# Patient Record
Sex: Male | Born: 1985 | Hispanic: No | Marital: Single | Smoking: Current every day smoker
Health system: Southern US, Community
[De-identification: ages and names within clinical notes are randomized; demographics above are authoritative.]

## PROBLEM LIST (undated history)

## (undated) HISTORY — PX: OTHER SURGICAL HISTORY: SHX169

## (undated) HISTORY — PX: WRIST ARTHROSCOPY: SUR100

---

## 2014-01-02 ENCOUNTER — Encounter (HOSPITAL_COMMUNITY): Payer: Self-pay | Admitting: Emergency Medicine

## 2014-01-02 ENCOUNTER — Emergency Department (HOSPITAL_COMMUNITY): Payer: Self-pay

## 2014-01-02 ENCOUNTER — Emergency Department (HOSPITAL_COMMUNITY)
Admission: EM | Admit: 2014-01-02 | Discharge: 2014-01-02 | Disposition: A | Discharge status: Home / Self Care | Payer: Self-pay | Attending: Emergency Medicine | Admitting: Emergency Medicine

## 2014-01-02 DIAGNOSIS — F1210 Cannabis abuse, uncomplicated: Secondary | ICD-10-CM | POA: Insufficient documentation

## 2014-01-02 DIAGNOSIS — T07XXXA Unspecified multiple injuries, initial encounter: Secondary | ICD-10-CM

## 2014-01-02 DIAGNOSIS — F22 Delusional disorders: Secondary | ICD-10-CM | POA: Insufficient documentation

## 2014-01-02 DIAGNOSIS — F10929 Alcohol use, unspecified with intoxication, unspecified: Secondary | ICD-10-CM

## 2014-01-02 DIAGNOSIS — IMO0002 Reserved for concepts with insufficient information to code with codable children: Secondary | ICD-10-CM | POA: Insufficient documentation

## 2014-01-02 DIAGNOSIS — S0083XA Contusion of other part of head, initial encounter: Secondary | ICD-10-CM | POA: Insufficient documentation

## 2014-01-02 DIAGNOSIS — F1010 Alcohol abuse, uncomplicated: Secondary | ICD-10-CM | POA: Insufficient documentation

## 2014-01-02 DIAGNOSIS — S0003XA Contusion of scalp, initial encounter: Secondary | ICD-10-CM | POA: Insufficient documentation

## 2014-01-02 DIAGNOSIS — S1093XA Contusion of unspecified part of neck, initial encounter: Secondary | ICD-10-CM

## 2014-01-02 LAB — CBC WITH DIFFERENTIAL/PLATELET
Basophils Absolute: 0 10*3/uL (ref 0.0–0.1)
Basophils Relative: 0 % (ref 0–1)
EOS ABS: 0 10*3/uL (ref 0.0–0.7)
Eosinophils Relative: 0 % (ref 0–5)
HCT: 47.5 % (ref 39.0–52.0)
Hemoglobin: 16.2 g/dL (ref 13.0–17.0)
LYMPHS ABS: 1.1 10*3/uL (ref 0.7–4.0)
Lymphocytes Relative: 12 % (ref 12–46)
MCH: 30.2 pg (ref 26.0–34.0)
MCHC: 34.1 g/dL (ref 30.0–36.0)
MCV: 88.5 fL (ref 78.0–100.0)
Monocytes Absolute: 0.4 10*3/uL (ref 0.1–1.0)
Monocytes Relative: 5 % (ref 3–12)
Neutro Abs: 7.2 10*3/uL (ref 1.7–7.7)
Neutrophils Relative %: 83 % — ABNORMAL HIGH (ref 43–77)
Platelets: 254 10*3/uL (ref 150–400)
RBC: 5.37 MIL/uL (ref 4.22–5.81)
RDW: 13.2 % (ref 11.5–15.5)
WBC: 8.7 10*3/uL (ref 4.0–10.5)

## 2014-01-02 LAB — BASIC METABOLIC PANEL
BUN: 8 mg/dL (ref 6–23)
CO2: 22 mEq/L (ref 19–32)
Calcium: 8.9 mg/dL (ref 8.4–10.5)
Chloride: 104 mEq/L (ref 96–112)
Creatinine, Ser: 1.04 mg/dL (ref 0.50–1.35)
GFR calc non Af Amer: >90 mL/min (ref >90)
GLUCOSE: 142 mg/dL — AB (ref 70–99)
Potassium: 3.3 mEq/L — ABNORMAL LOW (ref 3.7–5.3)
SODIUM: 141 meq/L (ref 137–147)

## 2014-01-02 LAB — ETHANOL: ALCOHOL ETHYL (B): 219 mg/dL — AB (ref 0–11)

## 2014-01-02 LAB — RAPID URINE DRUG SCREEN, HOSP PERFORMED
Amphetamines: NOT DETECTED
Barbiturates: NOT DETECTED
Benzodiazepines: NOT DETECTED
COCAINE: NOT DETECTED
Opiates: NOT DETECTED
Tetrahydrocannabinol: POSITIVE — AB

## 2014-01-02 NOTE — Discharge Instructions (Signed)
Alcohol Intoxication °Alcohol intoxication occurs when the amount of alcohol that a person has consumed impairs his or her ability to mentally and physically function. Alcohol directly impairs the normal chemical activity of the brain. Drinking large amounts of alcohol can lead to changes in mental function and behavior, and it can cause many physical effects that can be harmful.  °Alcohol intoxication can range in severity from mild to very severe. Various factors can affect the level of intoxication that occurs, such as the person's age, gender, weight, frequency of alcohol consumption, and the presence of other medical conditions (such as diabetes, seizures, or heart conditions). Dangerous levels of alcohol intoxication may occur when people drink large amounts of alcohol in a short period (binge drinking). Alcohol can also be especially dangerous when combined with certain prescription medicines or "recreational" drugs. °SIGNS AND SYMPTOMS °Some common signs and symptoms of mild alcohol intoxication include: °· Loss of coordination. °· Changes in mood and behavior. °· Impaired judgment. °· Slurred speech. °As alcohol intoxication progresses to more severe levels, other signs and symptoms will appear. These may include: °· Vomiting. °· Confusion and impaired memory. °· Slowed breathing. °· Seizures. °· Loss of consciousness. °DIAGNOSIS  °Your health care provider will take a medical history and perform a physical exam. You will be asked about the amount and type of alcohol you have consumed. Blood tests will be done to measure the concentration of alcohol in your blood. In many places, your blood alcohol level must be lower than 80 mg/dL (0.08%) to legally drive. However, many dangerous effects of alcohol can occur at much lower levels.  °TREATMENT  °People with alcohol intoxication often do not require treatment. Most of the effects of alcohol intoxication are temporary, and they go away as the alcohol naturally  leaves the body. Your health care provider will monitor your condition until you are stable enough to go home. Fluids are sometimes given through an IV access tube to help prevent dehydration.  °HOME CARE INSTRUCTIONS °· Do not drive after drinking alcohol. °· Stay hydrated. Drink enough water and fluids to keep your urine clear or pale yellow. Avoid caffeine.   °· Only take over-the-counter or prescription medicines as directed by your health care provider.   °SEEK MEDICAL CARE IF:  °· You have persistent vomiting.   °· You do not feel better after a few days. °· You have frequent alcohol intoxication. Your health care provider can help determine if you should see a substance use treatment counselor. °SEEK IMMEDIATE MEDICAL CARE IF:  °· You become shaky or tremble when you try to stop drinking.   °· You shake uncontrollably (seizure).   °· You throw up (vomit) blood. This may be bright red or may look like black coffee grounds.   °· You have blood in your stool. This may be bright red or may appear as a black, tarry, bad smelling stool.   °· You become lightheaded or faint.   °MAKE SURE YOU:  °· Understand these instructions. °· Will watch your condition. °· Will get help right away if you are not doing well or get worse. °Document Released: 04/19/2005 Document Revised: 03/12/2013 Document Reviewed: 12/13/2012 °Exit[employer]® Patient Information ©2014 [employer]. ° °Alcohol Problems °Most adults who drink alcohol drink in moderation (not a lot) are at low risk for developing problems related to their drinking. However, all drinkers, including low-risk drinkers, should know about the health risks connected with drinking alcohol. °RECOMMENDATIONS FOR LOW-RISK DRINKING  °Drink in moderation. Moderate drinking is defined as follows:  °·   Men - no more than 2 drinks per day. °· Nonpregnant women - no more than 1 drink per day. °· Over age 65 - no more than 1 drink per day. °A standard drink is 12 grams of pure alcohol,  which is equal to a 12 ounce bottle of beer or wine cooler, a 5 ounce glass of wine, or 1.5 ounces of distilled spirits (such as whiskey, brandy, vodka, or rum).  °ABSTAIN FROM (DO NOT DRINK) ALCOHOL: °· When pregnant or considering pregnancy. °· When taking a medication that interacts with alcohol. °· If you are alcohol dependent. °· A medical condition that prohibits drinking alcohol (such as ulcer, liver disease, or heart disease). °DISCUSS WITH YOUR CAREGIVER: °· If you are at risk for coronary heart disease, discuss the potential benefits and risks of alcohol use: Light to moderate drinking is associated with lower rates of coronary heart disease in certain populations (for example, men over age 45 and postmenopausal women). Infrequent or nondrinkers are advised not to begin light to moderate drinking to reduce the risk of coronary heart disease so as to avoid creating an alcohol-related problem. Similar protective effects can likely be gained through proper diet and exercise. °· Women and the elderly have smaller amounts of body water than men. As a result women and the elderly achieve a higher blood alcohol concentration after drinking the same amount of alcohol. °· Exposing a fetus to alcohol can cause a broad range of birth defects referred to as Fetal Alcohol Syndrome (FAS) or Alcohol-Related Birth Defects (ARBD). Although FAS/ARBD is connected with excessive alcohol consumption during pregnancy, studies also have reported neurobehavioral problems in infants born to mothers reporting drinking an average of 1 drink per day during pregnancy. °· Heavier drinking (the consumption of more than 4 drinks per occasion by men and more than 3 drinks per occasion by women) impairs learning (cognitive) and psychomotor functions and increases the risk of alcohol-related problems, including accidents and injuries. °CAGE QUESTIONS:  °· Have you ever felt that you should Cut down on your drinking? °· Have people Annoyed  you by criticizing your drinking? °· Have you ever felt bad or Guilty about your drinking? °· Have you ever had a drink first thing in the morning to steady your nerves or get rid of a hangover (Eye opener)? °If you answered positively to any of these questions: You may be at risk for alcohol-related problems if alcohol consumption is:  °· Men: Greater than 14 drinks per week or more than 4 drinks per occasion. °· Women: Greater than 7 drinks per week or more than 3 drinks per occasion. °Do you or your family have a medical history of alcohol-related problems, such as: °· Blackouts. °· Sexual dysfunction. °· Depression. °· Trauma. °· Liver dysfunction. °· Sleep disorders. °· Hypertension. °· Chronic abdominal pain. °· Has your drinking ever caused you problems, such as problems with your family, problems with your work (or school) performance, or accidents/injuries? °· Do you have a compulsion to drink or a preoccupation with drinking? °· Do you have poor control or are you unable to stop drinking once you have started? °· Do you have to drink to avoid withdrawal symptoms? °· Do you have problems with withdrawal such as tremors, nausea, sweats, or mood disturbances? °· Does it take more alcohol than in the past to get you high? °· Do you feel a strong urge to drink? °· Do you change your plans so that you can have a drink? °·   Do you ever drink in the morning to relieve the shakes or a hangover? If you have answered a number of the previous questions positively, it may be time for you to talk to your caregivers, family, and friends and see if they think you have a problem. Alcoholism is a chemical dependency that keeps getting worse and will eventually destroy your health and relationships. Many alcoholics end up dead, impoverished, or in prison. This is often the end result of all chemical dependency.  Do not be discouraged if you are not ready to take action immediately.  Decisions to change behavior often  involve up and down desires to change and feeling like you cannot decide.  Try to think more seriously about your drinking behavior.  Think of the reasons to quit. WHERE TO GO FOR ADDITIONAL INFORMATION   The National Institute on Alcohol Abuse and Alcoholism (NIAAA) BasicStudents.dkwww.niaaa.nih.gov  [employer] on Alcoholism and Drug Dependence (NCADD) www.ncadd.org  American Society of Addiction Medicine (ASAM) RoyalDiary.glwww.asam.org  Document Released: 07/10/2005 Document Revised: 10/02/2011 Document Reviewed: 02/26/2008 [facility] Patient Information 2014 [place], [place].   Alcohol Use Disorder Alcohol use disorder is a mental disorder. It is not a one-time incident of heavy drinking. Alcohol use disorder is the excessive and uncontrollable use of alcohol over time that leads to problems with functioning in one or more areas of daily living. People with this disorder risk harming themselves and others when they drink to excess. Alcohol use disorder also can cause other mental disorders, such as mood and anxiety disorders, and serious physical problems. People with alcohol use disorder often misuse other drugs.  Alcohol use disorder is common and widespread. Some people with this disorder drink alcohol to cope with or escape from negative life events. Others drink to relieve chronic pain or symptoms of mental illness. People with a family history of alcohol use disorder are at higher risk of losing control and using alcohol to excess.  SYMPTOMS  Signs and symptoms of alcohol use disorder may include the following:   Consumption ofalcohol inlarger amounts or over a longer period of time than intended.  Multiple unsuccessful attempts to cutdown or control alcohol use.   A great deal of time spent obtaining alcohol, using alcohol, or recovering from the effects of alcohol (hangover).  A strong desire or urge to use alcohol (cravings).   Continued use of alcohol despite problems at work, school, or  home because of alcohol use.   Continued use of alcohol despite problems in relationships because of alcohol use.  Continued use of alcohol in situations when it is physically hazardous, such as driving a car.  Continued use of alcohol despite awareness of a physical or psychological problem that is likely related to alcohol use. Physical problems related to alcohol use can involve the brain, heart, liver, stomach, and intestines. Psychological problems related to alcohol use include intoxication, depression, anxiety, psychosis, delirium, and dementia.   The need for increased amounts of alcohol to achieve the same desired effect, or a decreased effect from the consumption of the same amount of alcohol (tolerance).  Withdrawal symptoms upon reducing or stopping alcohol use, or alcohol use to reduce or avoid withdrawal symptoms. Withdrawal symptoms include:  Racing heart.  Hand tremor.  Difficulty sleeping.  Nausea.  Vomiting.  Hallucinations.  Restlessness.  Seizures. DIAGNOSIS Alcohol use disorder is diagnosed through an assessment by your caregiver. Your caregiver may start by asking three or four questions to screen for excessive or problematic alcohol use. To  confirm a diagnosis of alcohol use disorder, at least two symptoms (see SYMPTOMS) must be present within a 81-month period. The severity of alcohol use disorder depends on the number of symptoms:  Mild two or three.  Moderate four or five.  Severe six or more. Your caregiver may perform a physical exam or use results from lab tests to see if you have physical problems resulting from alcohol use. Your caregiver may refer you to a mental health professional for evaluation. TREATMENT  Some people with alcohol use disorder are able to reduce their alcohol use to low-risk levels. Some people with alcohol use disorder need to quit drinking alcohol. When necessary, mental health professionals with specialized training in  substance use treatment can help. Your caregiver can help you decide how severe your alcohol use disorder is and what type of treatment you need. The following forms of treatment are available:   Detoxification. Detoxification involves the use of prescription medication to prevent alcohol withdrawal symptoms in the first week after quitting. This is important for people with a history of symptoms of withdrawal and for heavy drinkers who are likely to have withdrawal symptoms. Alcohol withdrawal can be dangerous and, in severe cases, cause death. Detoxification is usually provided in a hospital or in-patient substance use treatment facility.  Counseling or talk therapy. Talk therapy is provided by substance use treatment counselors. It addresses the reasons people use alcohol and ways to keep them from drinking again. The goals of talk therapy are to help people with alcohol use disorder find healthy activities and ways to cope with life stress, to identify and avoid triggers for alcohol use, and to handle cravings, which can cause relapse.  Medication.Different medications can help treat alcohol use disorder through the following actions:  Decrease alcohol cravings.  Decrease the positive reward response felt from alcohol use.  Produce an uncomfortable physical reaction when alcohol is used (aversion therapy).  Support groups. Support groups are run by people who have quit drinking. They provide emotional support, advice, and guidance. These forms of treatment are often combined. Some people with alcohol use disorder benefit from intensive combination treatment provided by specialized substance use treatment centers. Both inpatient and outpatient treatment programs are available. Document Released: 08/17/2004 Document Revised: 03/12/2013 Document Reviewed: 10/17/2012 [facility] Patient Information 2014 [place], [place].   Abrasions An abrasion is a cut or scrape of the skin. Abrasions do not go  through all layers of the skin. HOME CARE  If a bandage (dressing) was put on your wound, change it as told by your doctor. If the bandage sticks, soak it off with warm.  Wash the area with water and soap 2 times a day. Rinse off the soap. Pat the area dry with a clean towel.  Put on medicated cream (ointment) as told by your doctor.  Change your bandage right away if it gets wet or dirty.  Only take medicine as told by your doctor.  See your doctor within 24 48 hours to get your wound checked.  Check your wound for redness, puffiness (swelling), or yellowish-white fluid (pus). GET HELP RIGHT AWAY IF:   You have more pain in the wound.  You have redness, swelling, or tenderness around the wound.  You have pus coming from the wound.  You have a fever or lasting symptoms for more than 2 3 days.  You have a fever and your symptoms suddenly get worse.  You have a bad smell coming from the wound or bandage. MAKE  SURE YOU:   Understand these instructions.  Will watch your condition.  Will get help right away if you are not doing well or get worse. Document Released: 12/27/2007 Document Revised: 04/03/2012 Document Reviewed: 06/13/2011 [facility] Patient Information 2014 [place], [place].   Alcohol Withdrawal Anytime drug use is interfering with normal living activities it has become abuse. This includes problems with family and friends. Psychological dependence has developed when your mind tells you that the drug is needed. This is usually followed by physical dependence when a continuing increase of drugs are required to get the same feeling or "high." This is known as addiction or chemical dependency. A person's risk is much higher if there is a history of chemical dependency in the family. Mild Withdrawal Following Stopping Alcohol, When Addiction or Chemical Dependency Has Developed When a person has developed tolerance to alcohol, any sudden stopping of alcohol can cause  uncomfortable physical symptoms. Most of the time these are mild and consist of tremors in the hands and increases in heart rate, breathing, and temperature. Sometimes these symptoms are associated with anxiety, panic attacks, and bad dreams. There may also be stomach upset. Normal sleep patterns are often interrupted with periods of inability to sleep (insomnia). This may last for 6 months. Because of this discomfort, many people choose to continue drinking to get rid of this discomfort and to try to feel normal. Severe Withdrawal with Decreased or No Alcohol Intake, When Addiction or Chemical Dependency Has Developed About five percent of alcoholics will develop signs of severe withdrawal when they stop using alcohol. One sign of this is development of generalized seizures (convulsions). Other signs of this are severe agitation and confusion. This may be associated with believing in things which are not real or seeing things which are not really there (delusions and hallucinations). Vitamin deficiencies are usually present if alcohol intake has been long-term. Treatment for this most often requires hospitalization and close observation. Addiction can only be helped by stopping use of all chemicals. This is hard but may save your life. With continual alcohol use, possible outcomes are usually loss of self respect and esteem, violence, and death. Addiction cannot be cured but it can be stopped. This often requires outside help and the care of professionals. Treatment centers are listed in the yellow pages under Cocaine, Narcotics, and Alcoholics Anonymous. Most hospitals and clinics can refer you to a specialized care center. It is not necessary for you to go through the uncomfortable symptoms of withdrawal. Your caregiver can provide you with medicines that will help you through this difficult period. Try to avoid situations, friends, or drugs that made it possible for you to keep using alcohol in the past.  Learn how to say no. It takes a long period of time to overcome addictions to all drugs, including alcohol. There may be many times when you feel as though you want a drink. After getting rid of the physical addiction and withdrawal, you will have a lessening of the craving which tells you that you need alcohol to feel normal. Call your caregiver if more support is needed. Learn who to talk to in your family and among your friends so that during these periods you can receive outside help. Alcoholics Anonymous (AA) has helped many people over the years. To get further help, contact AA or call your caregiver, counselor, or clergyperson. Al-Anon and Alateen are support groups for friends and family members of an alcoholic. The people who love and care for an  alcoholic often need help, too. For information about these organizations, check your phone directory or call a local alcoholism treatment center.  SEEK IMMEDIATE MEDICAL CARE IF:   You have a seizure.  You have a fever.  You experience uncontrolled vomiting or you vomit up blood. This may be bright red or look like black coffee grounds.  You have blood in the stool. This may be bright red or appear as a black, tarry, bad-smelling stool.  You become lightheaded or faint. Do not drive if you feel this way. Have someone else drive you or call [phone] for help.  You become more agitated or confused.  You develop uncontrolled anxiety.  You begin to see things that are not really there (hallucinate). Your caregiver has determined that you completely understand your medical condition, and that your mental state is back to normal. You understand that you have been treated for alcohol withdrawal, have agreed not to drink any alcohol for a minimum of 1 day, will not operate a car or other machinery for 24 hours, and have had an opportunity to ask any questions about your condition. Document Released: 04/19/2005 Document Revised: 10/02/2011 Document Reviewed:  02/26/2008 [facility] Patient Information 2014 [place], [place].  Contusion A contusion is a deep bruise. Contusions happen when an injury causes bleeding under the skin. Signs of bruising include pain, puffiness (swelling), and discolored skin. The contusion may turn blue, purple, or yellow. HOME CARE   Put ice on the injured area.  Put ice in a plastic bag.  Place a towel between your skin and the bag.  Leave the ice on for 15-20 minutes, 03-04 times a day.  Only take medicine as told by your doctor.  Rest the injured area.  If possible, raise (elevate) the injured area to lessen puffiness. GET HELP RIGHT AWAY IF:   You have more bruising or puffiness.  You have pain that is getting worse.  Your puffiness or pain is not helped by medicine. MAKE SURE YOU:   Understand these instructions.  Will watch your condition.  Will get help right away if you are not doing well or get worse. Document Released: 12/27/2007 Document Revised: 10/02/2011 Document Reviewed: 05/15/2011 [facility] Patient Information 2014 [place], [place].  Cryotherapy Cryotherapy means treatment with cold. Ice or gel packs can be used to reduce both pain and swelling. Ice is the most helpful within the first 24 to 48 hours after an injury or flareup from overusing a muscle or joint. Sprains, strains, spasms, burning pain, shooting pain, and aches can all be eased with ice. Ice can also be used when recovering from surgery. Ice is effective, has very few side effects, and is safe for most people to use. PRECAUTIONS  Ice is not a safe treatment option for people with:  Raynaud's phenomenon. This is a condition affecting small blood vessels in the extremities. Exposure to cold may cause your problems to return.  Cold hypersensitivity. There are many forms of cold hypersensitivity, including:  Cold urticaria. Red, itchy hives appear on the skin when the tissues begin to warm after being iced.  Cold erythema. This  is a red, itchy rash caused by exposure to cold.  Cold hemoglobinuria. Red blood cells break down when the tissues begin to warm after being iced. The hemoglobin that carry oxygen are passed into the urine because they cannot combine with blood proteins fast enough.  Numbness or altered sensitivity in the area being iced. If you have any of the following conditions,  do not use ice until you have discussed cryotherapy with your caregiver:  Heart conditions, such as arrhythmia, angina, or chronic heart disease.  High blood pressure.  Healing wounds or open skin in the area being iced.  Current infections.  Rheumatoid arthritis.  Poor circulation.  Diabetes. Ice slows the blood flow in the region it is applied. This is beneficial when trying to stop inflamed tissues from spreading irritating chemicals to surrounding tissues. However, if you expose your skin to cold temperatures for too long or without the proper protection, you can damage your skin or nerves. Watch for signs of skin damage due to cold. HOME CARE INSTRUCTIONS Follow these tips to use ice and cold packs safely.  Place a dry or damp towel between the ice and skin. A damp towel will cool the skin more quickly, so you may need to shorten the time that the ice is used.  For a more rapid response, add gentle compression to the ice.  Ice for no more than 10 to 20 minutes at a time. The bonier the area you are icing, the less time it will take to get the benefits of ice.  Check your skin after 5 minutes to make sure there are no signs of a poor response to cold or skin damage.  Rest 20 minutes or more in between uses.  Once your skin is numb, you can end your treatment. You can test numbness by very lightly touching your skin. The touch should be so light that you do not see the skin dimple from the pressure of your fingertip. When using ice, most people will feel these normal sensations in this order: cold, burning, aching,  and numbness.  Do not use ice on someone who cannot communicate their responses to pain, such as small children or people with dementia. HOW TO MAKE AN ICE PACK Ice packs are the most common way to use ice therapy. Other methods include ice massage, ice baths, and cryo-sprays. Muscle creams that cause a cold, tingly feeling do not offer the same benefits that ice offers and should not be used as a substitute unless recommended by your caregiver. To make an ice pack, do one of the following:  Place crushed ice or a bag of frozen vegetables in a sealable plastic bag. Squeeze out the excess air. Place this bag inside another plastic bag. Slide the bag into a pillowcase or place a damp towel between your skin and the bag.  Mix 3 parts water with 1 part rubbing alcohol. Freeze the mixture in a sealable plastic bag. When you remove the mixture from the freezer, it will be slushy. Squeeze out the excess air. Place this bag inside another plastic bag. Slide the bag into a pillowcase or place a damp towel between your skin and the bag. SEEK MEDICAL CARE IF:  You develop white spots on your skin. This may give the skin a blotchy (mottled) appearance.  Your skin turns blue or pale.  Your skin becomes waxy or hard.  Your swelling gets worse. MAKE SURE YOU:   Understand these instructions.  Will watch your condition.  Will get help right away if you are not doing well or get worse. Document Released: 03/06/2011 Document Revised: 10/02/2011 Document Reviewed: 03/06/2011 [facility] Patient Information 2014 [place], [place].
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
## 2014-01-02 NOTE — ED Notes (Signed)
Pt's father coming to ED to transport pt home.

## 2014-01-02 NOTE — ED Notes (Signed)
Patient transported to CT via stretcher.
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
## 2014-01-02 NOTE — ED Notes (Signed)
Dr Otter at bedside  

## 2014-01-02 NOTE — ED Notes (Signed)
Writer requested for a urine sample and pt brought back a cup of water in a specimen cup, pt sts that it was water and he did urinate in the specimen cup.  Writer notified pt that his urine sample was cold and looked exactly like water.  Writer notified RN of patient activities.
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
## 2016-10-04 ENCOUNTER — Emergency Department (HOSPITAL_COMMUNITY)
Admission: EM | Admit: 2016-10-04 | Discharge: 2016-10-06 | Disposition: A | Discharge status: Other Acute Inpatient Hospital | Payer: Self-pay

## 2016-10-04 ENCOUNTER — Encounter (HOSPITAL_COMMUNITY): Payer: Self-pay | Admitting: *Deleted

## 2016-10-04 DIAGNOSIS — K047 Periapical abscess without sinus: Secondary | ICD-10-CM | POA: Insufficient documentation

## 2016-10-04 DIAGNOSIS — T426X2A Poisoning by other antiepileptic and sedative-hypnotic drugs, intentional self-harm, initial encounter: Secondary | ICD-10-CM | POA: Insufficient documentation

## 2016-10-04 DIAGNOSIS — T50902A Poisoning by unspecified drugs, medicaments and biological substances, intentional self-harm, initial encounter: Secondary | ICD-10-CM

## 2016-10-04 DIAGNOSIS — F17210 Nicotine dependence, cigarettes, uncomplicated: Secondary | ICD-10-CM | POA: Insufficient documentation

## 2016-10-04 DIAGNOSIS — T428X2A Poisoning by antiparkinsonism drugs and other central muscle-tone depressants, intentional self-harm, initial encounter: Secondary | ICD-10-CM | POA: Insufficient documentation

## 2016-10-04 DIAGNOSIS — F1120 Opioid dependence, uncomplicated: Secondary | ICD-10-CM | POA: Insufficient documentation

## 2016-10-04 LAB — COMPREHENSIVE METABOLIC PANEL
ALT: 13 U/L — ABNORMAL LOW (ref 17–63)
ANION GAP: 7 (ref 5–15)
AST: 15 U/L (ref 15–41)
Albumin: 4.4 g/dL (ref 3.5–5.0)
Alkaline Phosphatase: 71 U/L (ref 38–126)
BUN: 6 mg/dL (ref 6–20)
CO2: 30 mmol/L (ref 22–32)
Calcium: 9.1 mg/dL (ref 8.9–10.3)
Chloride: 101 mmol/L (ref 101–111)
Creatinine, Ser: 1.24 mg/dL (ref 0.61–1.24)
GFR calc Af Amer: >60 mL/min (ref >60)
Glucose, Bld: 89 mg/dL (ref 65–99)
POTASSIUM: 3.6 mmol/L (ref 3.5–5.1)
Sodium: 138 mmol/L (ref 135–145)
Total Bilirubin: 1 mg/dL (ref 0.3–1.2)
Total Protein: 7.6 g/dL (ref 6.5–8.1)

## 2016-10-04 LAB — CBC WITH DIFFERENTIAL/PLATELET
BASOS ABS: 0 10*3/uL (ref 0.0–0.1)
Basophils Relative: 0 %
Eosinophils Absolute: 0.2 10*3/uL (ref 0.0–0.7)
Eosinophils Relative: 2 %
HCT: 48.3 % (ref 39.0–52.0)
Hemoglobin: 16.4 g/dL (ref 13.0–17.0)
LYMPHS PCT: 21 %
Lymphs Abs: 2.4 10*3/uL (ref 0.7–4.0)
MCH: 30.4 pg (ref 26.0–34.0)
MCHC: 34 g/dL (ref 30.0–36.0)
MCV: 89.6 fL (ref 78.0–100.0)
Monocytes Absolute: 0.8 10*3/uL (ref 0.1–1.0)
Monocytes Relative: 7 %
Neutro Abs: 7.9 10*3/uL — ABNORMAL HIGH (ref 1.7–7.7)
Neutrophils Relative %: 70 %
PLATELETS: 255 10*3/uL (ref 150–400)
RBC: 5.39 MIL/uL (ref 4.22–5.81)
RDW: 13.2 % (ref 11.5–15.5)
WBC: 11.3 10*3/uL — AB (ref 4.0–10.5)

## 2016-10-04 LAB — URINALYSIS, ROUTINE W REFLEX MICROSCOPIC
Glucose, UA: NEGATIVE mg/dL
HGB URINE DIPSTICK: NEGATIVE
KETONES UR: NEGATIVE mg/dL
Leukocytes, UA: NEGATIVE
Nitrite: NEGATIVE
PROTEIN: NEGATIVE mg/dL
Specific Gravity, Urine: 1.019 (ref 1.005–1.030)
pH: 5 (ref 5.0–8.0)

## 2016-10-04 LAB — RAPID URINE DRUG SCREEN, HOSP PERFORMED
AMPHETAMINES: NOT DETECTED
Barbiturates: NOT DETECTED
Benzodiazepines: POSITIVE — AB
Cocaine: NOT DETECTED
OPIATES: POSITIVE — AB
Tetrahydrocannabinol: POSITIVE — AB

## 2016-10-04 LAB — ETHANOL

## 2016-10-04 LAB — SALICYLATE LEVEL: Salicylate Lvl: <7 mg/dL (ref 2.8–30.0)

## 2016-10-04 LAB — ACETAMINOPHEN LEVEL

## 2016-10-04 MED ORDER — SODIUM CHLORIDE 0.9 % IV BOLUS (SEPSIS)
500.0000 mL | Freq: Once | INTRAVENOUS | Status: AC
  Administered 2016-10-04: 500 mL via INTRAVENOUS

## 2016-10-04 NOTE — ED Provider Notes (Signed)
[facility] DEPT Provider Note   CSN: [id number] Arrival date & time: 10/04/16  1922     History   Chief Complaint Chief Complaint  Patient presents with  . Ingestion    HPI Willie Ruiz is a 31 y.o. male.  The patient was at home with his mother, around 5 PM tonight, when he took "2 handfuls of pills".  His mother thinks he took 20 300 mg gabapentin, 8-10 750 mg Robaxin, and a couple of Phenergan 25 mg tablets.  The patient is vague about why he took this medicine, and states "I want to see how they tasted".  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  He went to a local ED 2 days ago, and was prescribed the medicines that he overdosed on, as treatment for withdrawal from oxycodone.  He was given outpatient resources, but did not choose to use them.  He apparently lives with his mother currently.  He is here in the ED with a friend.  He states that he typically uses about 100 mg of oxycodone each day, but has not had any since 3 days ago.  He feels like he is going through withdrawal, having symptoms including; inability to manage body temperature, nausea, vomiting, achiness, abdominal cramping, and confusion.  He denies use of other drugs or alcohol.  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  He has not seen a dentist in quite some time.  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  There are no other known modifying factors.   HPI  History reviewed. No pertinent past medical history.  Patient Active Problem List   Diagnosis Date Noted  . Alcohol intoxication (HCC) 01/02/2014  . Marijuana use 01/02/2014  . Nasal bones, closed fracture 01/02/2014    History reviewed. No pertinent surgical history.     Home Medications    Prior to Admission medications   Not on File    Family History No family history on file.  Social History Social History    Substance Use Topics  . Smoking status: Current Every Day Smoker    Types: Cigarettes  . Smokeless tobacco: Never Used  . Alcohol use Yes     Allergies   Patient has no known allergies.   Review of Systems Review of Systems  All other systems reviewed and are negative.    Physical Exam Updated Vital Signs BP 117/98 (BP Location: Right Arm)   Pulse 84   Temp 98.4 F (36.9 C) (Oral)   Resp 16   SpO2 97%   Physical Exam  Constitutional: He is oriented to person, place, and time. He appears well-developed and well-nourished. He appears distressed (He is uncomfortable).  HENT:  Head: Normocephalic and atraumatic.  Right Ear: External ear normal.  Left Ear: External ear normal.  Eyes: Conjunctivae and EOM are normal. Pupils are equal, round, and reactive to light.  Neck: Normal range of motion and phonation normal. Neck supple.  Cardiovascular: Normal rate, regular rhythm and normal heart sounds.   Pulmonary/Chest: Effort normal and breath sounds normal. He exhibits no bony tenderness.  Abdominal: Soft. He exhibits no distension. There is no tenderness.  Musculoskeletal: Normal range of motion.  Neurological: He is alert and oriented to person, place, and time. No cranial nerve deficit or sensory deficit. He exhibits normal muscle tone. Coordination normal.  He is dysarthric.  There is no aphasia or nystagmus.  Skin: Skin is warm, dry and intact.  Psychiatric: He has a normal mood and affect. His behavior is normal. Judgment and thought content normal.  Nursing note and vitals reviewed.    ED Treatments / Results  Labs (all labs ordered are listed, but only abnormal results are displayed) Labs Reviewed  COMPREHENSIVE METABOLIC PANEL - Abnormal; Notable for the following:       Result Value   ALT 13 (*)    All other components within normal limits  CBC WITH DIFFERENTIAL/PLATELET - Abnormal; Notable for the following:    WBC 11.3 (*)    Neutro Abs 7.9 (*)    All  other components within normal limits  ACETAMINOPHEN LEVEL - Abnormal; Notable for the following:    Acetaminophen (Tylenol), Serum <10 (*)    All other components within normal limits  URINALYSIS, ROUTINE W REFLEX MICROSCOPIC - Abnormal; Notable for the following:    Bilirubin Urine SMALL (*)    All other components within normal limits  RAPID URINE DRUG SCREEN, HOSP PERFORMED - Abnormal; Notable for the following:    Opiates POSITIVE (*)    Benzodiazepines POSITIVE (*)    Tetrahydrocannabinol POSITIVE (*)    All other components within normal limits  SALICYLATE LEVEL  ETHANOL    EKG  EKG Interpretation  Date/Time:  Wednesday October 04 2016 21:04:20 EDT Ventricular Rate:  91 PR Interval:    QRS Duration: 105 QT Interval:  344 QTC Calculation: 424 R Axis:   -18 Text Interpretation:  Sinus rhythm Left atrial enlargement Borderline left axis deviation RSR' in V1 or V2, probably normal variant Baseline wander in lead(s) V3 Artifact No old tracing to compare Confirmed by [facility]  MD, DAVID ([id number]) on 10/04/2016 9:09:04 PM       Radiology No results found.  Procedures Procedures (including critical care time)  Medications Ordered in ED Medications  sodium chloride 0.9 % bolus 500 mL (500 mLs Intravenous New Bag/Given 10/04/16 2129)     Initial Impression / Assessment and Plan / ED Course  I have reviewed the triage vital signs and the nursing notes.  Pertinent labs & imaging results that were available during my care of the patient were reviewed by me and considered in my medical decision making (see chart for details).  Clinical Course as of Oct 06 102  Wed Oct 04, 2016  2136 I have discussed the case in the current findings, with [facility] poison control.  We are advising repeat EKG at 4 hours, and observe for 8 hours, in addition to the treatments and observation already ordered.  [EW]  2138 Involuntary commitment paperwork filled out by me and notarized  [EW]      Clinical Course User Index [EW] Mancel Bale, MD    Medications  sodium chloride 0.9 % bolus 500 mL (500 mLs Intravenous New Bag/Given 10/04/16 2129)    Patient Vitals for the past 24 hrs:  BP Temp Temp src Pulse Resp SpO2  10/04/16 2116 117/98 - - 84 16 97 %  10/04/16 2100 139/90 - - 83 11 99 %  10/04/16 2030 141/98 - - 92 25 99 %  10/04/16 1937 135/95 98.4 F (36.9 C) Oral 115 22 99 %    1:04 AM Reevaluation with update and discussion. After initial assessment and treatment, an updated evaluation reveals patient is more alert now, is acting very animated, and refusing to have repeat EKG done.  Medical clearance underway, will be complete  at 0400 hours. Brenda Samano L    Final Clinical Impressions(s) / ED Diagnoses   Final diagnoses:  Intentional drug overdose, initial encounter [facility](HCC)  Narcotic addiction (HCC)   Intentional overdose, essentially suicide gesture, without stated intent.  Patient is psychiatrically unstable, currently going through narcotic withdrawal.  He has been placed under involuntary commitment.  He will require evaluation by psychiatry prior to disposition.  Nursing Notes Reviewed/ Care Coordinated Applicable Imaging Reviewed Interpretation of Laboratory Data incorporated into ED treatment   Plan- As per TTS in conjunction with oncoming provider team   New Prescriptions New Prescriptions   No medications on file     Mancel BaleElliott Shyloh Derosa, MD 10/05/16 [phone]

## 2016-10-04 NOTE — ED Triage Notes (Signed)
Pt comes in taking he took:  "about 2 handfulls" of 300mg  gabapentin (mother thinks it was 20pills),  8-10tab 750mg  robaxin 1-2 tab phenergan 25mg  po  Pt states that he took this just because.  Mother is with his and states that he took this to harm himself.  [facility] was [facility] at [facility] on Monday wanting help getting over his oxycodone addiction (pt is buying these on the streets) and the medications that he took today were prescribed to him "to help him get off of oxycodone" per mother.    Pt called crisis center and told them he wanted help to get off drugs.

## 2016-10-05 ENCOUNTER — Inpatient Hospital Stay (HOSPITAL_COMMUNITY)
Admission: EM | Admit: 2016-10-05 | Payer: Federal, State, Local not specified - Other | Source: Intra-hospital | Admitting: Psychiatry

## 2016-10-05 LAB — I-STAT TROPONIN, ED: TROPONIN I, POC: 0 ng/mL (ref 0.00–0.08)

## 2016-10-05 MED ORDER — HYDROXYZINE HCL 25 MG PO TABS
25.0000 mg | ORAL_TABLET | ORAL | Status: DC | PRN
  Administered 2016-10-05 – 2016-10-06 (×2): 25 mg via ORAL
  Filled 2016-10-05 (×2): qty 1

## 2016-10-05 MED ORDER — LORAZEPAM 1 MG PO TABS
1.0000 mg | ORAL_TABLET | Freq: Three times a day (TID) | ORAL | Status: DC | PRN
  Administered 2016-10-05: 1 mg via ORAL
  Filled 2016-10-05: qty 1

## 2016-10-05 MED ORDER — TRAMADOL HCL 50 MG PO TABS
50.0000 mg | ORAL_TABLET | Freq: Once | ORAL | Status: AC
  Administered 2016-10-05: 50 mg via ORAL
  Filled 2016-10-05: qty 1

## 2016-10-05 MED ORDER — CLINDAMYCIN HCL 150 MG PO CAPS
300.0000 mg | ORAL_CAPSULE | Freq: Four times a day (QID) | ORAL | Status: DC
  Administered 2016-10-05 – 2016-10-06 (×4): 300 mg via ORAL
  Filled 2016-10-05 (×4): qty 2

## 2016-10-05 MED ORDER — IBUPROFEN 400 MG PO TABS
600.0000 mg | ORAL_TABLET | Freq: Three times a day (TID) | ORAL | Status: DC | PRN
  Administered 2016-10-05 – 2016-10-06 (×3): 600 mg via ORAL
  Filled 2016-10-05 (×3): qty 1

## 2016-10-05 MED ORDER — ACETAMINOPHEN 325 MG PO TABS
650.0000 mg | ORAL_TABLET | ORAL | Status: DC | PRN
  Administered 2016-10-05 – 2016-10-06 (×3): 650 mg via ORAL
  Filled 2016-10-05 (×3): qty 2

## 2016-10-05 NOTE — ED Notes (Signed)
Evening meal ordered. 

## 2016-10-05 NOTE — ED Notes (Addendum)
[facility] @ [phone]. No answer. Contacting CSW and CN to verify correct number or if other issue.

## 2016-10-05 NOTE — Progress Notes (Addendum)
Ref[facility]---Please consider checking cardiac enzymes to r/o pericarditis as per EKG there is ST elevation.

## 2016-10-05 NOTE — ED Triage Notes (Signed)
[facility]  [facility].

## 2016-10-05 NOTE — ED Triage Notes (Signed)
[facility] no answer @ [phone]

## 2016-10-05 NOTE — Progress Notes (Signed)
Per Karleen HampshireSpencer, [place] [facility] Deady K. Sherlon HandingHarris, LCAS-A, LPC-A, [facility]  Counselor 10/05/2016 3:46 AM

## 2016-10-05 NOTE — ED Notes (Signed)
Patient currently calm and sleeping.

## 2016-10-05 NOTE — Progress Notes (Signed)
Contacted Pod C Hubbard RN to order cart for [employer] Ashleymarie Granderson K. Sherlon HandingHarris, LCAS-A, LPC-A, [facility]  Counselor 10/05/2016 3:15 AM

## 2016-10-05 NOTE — ED Provider Notes (Addendum)
Behavorial Health concerned about [facility] that states "acute pericarditis".  Patient does not and has not had chest pain. Unlikely to to be pericarditis.   Saw patient appears well, no complaint of CP.  Repeat EKG appears similar.  No concerns.  Patient does have swelling to left lower jaw.  He reports it happens periodically because of poor dentetnion and he take abx, and it gets better. No drainable abscess.  Diffusely poor dentition.  Will start clinda.    EKG Interpretation  Date/Time:  Thursday October 05 2016 10:28:46 EDT Ventricular Rate:  94 PR Interval:  162 QRS Duration: 96 QT Interval:  348 QTC Calculation: 435 R Axis:   7 Text Interpretation:  Normal sinus rhythm Normal ECG No significant change since last tracing Confirmed by Kandis MannanMACKUEN, COURTNEY ([id number]) on 10/05/2016 10:56:07 AM       Courteney Randall AnLyn Mackuen, MD 10/05/16 [id number]    Courteney Lyn Mackuen, MD 10/05/16 1057

## 2016-10-05 NOTE — ED Notes (Signed)
Pt has been transferred to PODF but is uneasy and agitated; pt states tooth pain but does not want to stay; pt given info on IVC and dispo decision of inpatient tx; Pt started to become verbally aggressive toward staff and security called and at bedside on stand by; Pt given pain meds and ativan to help with anxiety; Pt continues to state he is not SI/HI at this time

## 2016-10-05 NOTE — ED Notes (Signed)
Lunch ordered 

## 2016-10-05 NOTE — ED Notes (Signed)
Patient [facility] by Dr. Ardyth HarpsHernandez.  Attending Physician will be Dr. Ardyth HarpsHernandez.   Patient has been [facility] to room 306, [facility] PearsallPhyllis.   Intake Paper Work has been signed and placed on patient chart.  BMU staff & Clarissa ( Patient Access) are aware of the admission  Representative was [place].   Call report to [phone].

## 2016-10-05 NOTE — ED Triage Notes (Signed)
TC to  [phone] no  Answer

## 2016-10-05 NOTE — ED Notes (Signed)
Pt called Millie RN and ask to speak with supervisor. I came to the pod with [employer] and security to address concerns.

## 2016-10-05 NOTE — BH Assessment (Signed)
Tele Assessment Note   Willie Ruiz is an 31 y.o. male, Caucasian who presents to [facility] ED per ED report: The patient was at home with his mother, around 5 PM tonight, when he took "2 handfuls of pills".  His mother thinks he took 20 300 mg gabapentin, 8-10 750 mg Robaxin, and a couple of Phenergan 25 mg tablets.  The patient is vague about why he took this medicine, and states "I want to see how they tasted".  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  He went to a local ED 2 days ago, and was prescribed the medicines that he overdosed on, as treatment for withdrawal from oxycodone.  He was given outpatient resources, but did not choose to use them.  He apparently lives with his mother currently.  He is here in the ED with a friend.  He states that he typically uses about 100 mg of oxycodone each day, but has not had any since 3 days ago.  He feels like he is going through withdrawal, having symptoms including; inability to manage body temperature, nausea, vomiting, achiness, abdominal cramping, and confusion.  He denies use of other drugs or alcohol.  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  He has not seen a dentist in quite some time.  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   Patient was poor historian, and primary c/o admits to taking a lot of pills earlier. Patient states he resides with brother. Patient states no psych history. Patient states that he has had some loss of sleep lately, but reports of up to about  5 hours per night [although pt. Not very forward about amount of direct sleep].  Patient denies current SI/HI and AVH. Patient acknowledges hx. Of S.A. With Oxycodone last use was 10/01/16 with 90 mg. Patient dneis hx. Of inpatient psych care or outpatient psych care. Patient is dressed in scrubs and is alert and oriented x4, but presneting drowsy at time of  assessement. Patient speech was within normal limits and motor behavior appeared normal. Patient thought process is coherent. Patient  does not appear to be responding to internal stimuli. Patient was cooperative throughout the assessment and states that he  Is not  agreeable to inpatient psychiatric treatment.   Diagnosis: Substance Induced Mood Disorder  Past Medical History: History reviewed. No pertinent past medical history.  History reviewed. No pertinent surgical history.  Family History: No family history on file.  Social History:  reports that he has been smoking Cigarettes.  He has never used smokeless tobacco. He reports that he drinks alcohol. He reports that he uses drugs.  Additional Social History:  Alcohol / Drug Use Pain Medications: SEE MAR Prescriptions: SEE MAR Over the Counter: SEE MAR History of alcohol / drug use?: Yes Longest period of sobriety (when/how long): unspecified Negative Consequences of Use: Financial, Armed forces operational officer, Work / Programmer, multimedia, Personal relationships Withdrawal Symptoms: Patient aware of relationship between substance abuse and physical/medical complications  CIWA: CIWA-Ar BP: 117/98 Pulse Rate: 84 COWS:    PATIENT STRENGTHS: (choose at least two) Average or above average intelligence Capable of independent living Communication skills  Allergies: No Known Allergies  Home Medications:  (Not in a hospital admission)  OB/GYN Status:  No LMP for male patient.  General Assessment Data Location of Assessment: [facility] ED TTS Assessment: In system Is this a  Tele or Face-to-Face Assessment?: Tele Assessment Is this an Initial Assessment or a Re-assessment for this encounter?: Initial Assessment Marital status: Single Maiden name: n/a Is patient pregnant?: No Pregnancy Status: No Living Arrangements: Other relatives Can pt return to current living arrangement?: Yes Admission Status: Involuntary Is patient capable of signing voluntary admission?:  Yes Referral Source: Other Insurance type: SP     Crisis Care Plan Living Arrangements: Other relatives Name of Psychiatrist: none Name of Therapist: none  Education Status Is patient currently in school?: No Current Grade: n/a Highest grade of school patient has completed: 10th Name of school: n/a Contact person: none given  Risk to self with the past 6 months Suicidal Ideation: No Has patient been a risk to self within the past 6 months prior to admission? : No Suicidal Intent: No Has patient had any suicidal intent within the past 6 months prior to admission? : No Is patient at risk for suicide?: Yes Suicidal Plan?: No Has patient had any suicidal plan within the past 6 months prior to admission? : No Access to Means: No What has been your use of drugs/alcohol within the last 12 months?: Oxycodone Previous Attempts/Gestures: No How many times?: 0 Other Self Harm Risks: no Triggers for Past Attempts: Unpredictable Intentional Self Injurious Behavior: None Family Suicide History: No Recent stressful life event(s): Turmoil (Comment) Persecutory voices/beliefs?: No Depression: Yes Depression Symptoms: Tearfulness, Isolating, Fatigue, Guilt, Loss of interest in usual pleasures, Feeling worthless/self pity Substance abuse history and/or treatment for substance abuse?: Yes Suicide prevention information given to non-admitted patients: Yes  Risk to Others within the past 6 months Homicidal Ideation: No Does patient have any lifetime risk of violence toward others beyond the six months prior to admission? : No Thoughts of Harm to Others: No Current Homicidal Intent: No Current Homicidal Plan: No Access to Homicidal Means: No Identified Victim: none History of harm to others?: No Assessment of Violence: None Noted Violent Behavior Description: none Does patient have access to weapons?: No Criminal Charges Pending?: No Does patient have a court date: No Is patient on  probation?: No  Psychosis Hallucinations: None noted Delusions: None noted  Mental Status Report Appearance/Hygiene: In scrubs Eye Contact: Poor Motor Activity: Freedom of movement Speech: Logical/coherent Level of Consciousness: Drowsy Mood: Depressed, Despair Affect: Depressed Anxiety Level: Moderate Thought Processes: Relevant Judgement: Impaired Orientation: Person, Place, Time, Situation, Appropriate for developmental age Obsessive Compulsive Thoughts/Behaviors: None  Cognitive Functioning Concentration: Normal Memory: Recent Intact, Remote Intact IQ: Average Insight: Poor Impulse Control: Poor Appetite: Fair Weight Loss: 0 Weight Gain: 0 Sleep: Decreased Total Hours of Sleep: 4 Vegetative Symptoms: None  ADLScreening [facility] Assessment Services) Patient's cognitive ability adequate to safely complete daily activities?: Yes Patient able to express need for assistance with ADLs?: Yes Independently performs ADLs?: Yes (appropriate for developmental age)  Prior Inpatient Therapy Prior Inpatient Therapy: No Prior Therapy Dates: n/a Prior Therapy Facilty/Provider(s): n/a Reason for Treatment: n/a  Prior Outpatient Therapy Prior Outpatient Therapy: No Prior Therapy Dates: n/a Prior Therapy Facilty/Provider(s): n/a Reason for Treatment: n/a Does patient have an ACCT team?: No Does patient have Intensive In-House Services?  : No Does patient have [facility]arch services? : No Does patient have P4CC services?: No  ADL Screening (condition at time of admission) Patient's cognitive ability adequate to safely complete daily activities?: Yes Is the patient deaf or have difficulty hearing?: No Does the patient have difficulty seeing, even when wearing glasses/contacts?: No Does the patient have difficulty concentrating, remembering, or making  decisions?: No Patient able to express need for assistance with ADLs?: Yes Does the patient have difficulty dressing or bathing?:  No Independently performs ADLs?: Yes (appropriate for developmental age) Does the patient have difficulty walking or climbing stairs?: No Weakness of Legs: None Weakness of Arms/Hands: None       Abuse/Neglect Assessment (Assessment to be complete while patient is alone) Physical Abuse: Denies Verbal Abuse: Denies Sexual Abuse: Denies Exploitation of patient/patient's resources: Denies Self-Neglect: Denies Values / Beliefs Cultural Requests During Hospitalization: None Spiritual Requests During Hospitalization: None   Advance Directives (For Healthcare) Does Patient Have a Medical Advance Directive?: No    Additional Information 1:1 In Past 12 Months?: No CIRT Risk: No Elopement Risk: No Does patient have medical clearance?: No     Disposition: Per Donell Sievert PA meets inpatient criteria Disposition Initial Assessment Completed for this Encounter: Yes Disposition of Patient: Other dispositions (TBD)  Hipolito Bayley 10/05/2016 3:37 AM

## 2016-10-05 NOTE — ED Notes (Addendum)
Pt called out for this RN, asking for something for his anxiety. States "I'm freaking out & going out of my mind. Feel like I'm a prisoner here and claustrophobic because I can't leave."   Pt presently lying comfortably in bed, in NAD. Palpated radial HR 76. No tremors, tics, sweating, or restlessness noted upon observation. Pt continues to deny SI or ingestion with intent to harm himself. Pt states, "I got into an argument with my mom, and just took a bunch of gabapentin because. I KNOW I didn't take too much. I was just trying to get fucked up."   I told pt that I would look at his chart and contact his EDP about possibly getting him something for his anxiety. Reinforced that he had received [facility], after which it was the decision of [facility] staff that he remain for inpatient therapy. Encouraged pt to work with staff and counselors to help him get to a better place.

## 2016-10-05 NOTE — ED Notes (Signed)
Pt out of room making phone call

## 2016-10-05 NOTE — ED Triage Notes (Signed)
TC to  [phone] ,Staff answered and I was instructed to call [phone].

## 2016-10-05 NOTE — ED Notes (Signed)
Pt c/o pain at left jaw and sweliing noted at left anterior jawline. No redness or bruising noted.

## 2016-10-05 NOTE — ED Triage Notes (Signed)
PT at stafF desk . Pt reports there has been no change in the swelling to his Lt side of face. Pt reported I want it gone. Pt informed he has received one dose of Ant-iBX and the swelling to Lt side of face may be there for 2-3 days. Pt reported I want it gone. Pt reported you people have not done anything for it. Pt was asked what he thinks would help swelling. SN reported to PT he can apply warm compresses to LT cheek and SN will ask the EDP for Pain med that is stronger than Advil.

## 2016-10-05 NOTE — ED Notes (Signed)
Dr. Vinnie LevelMckuen at bedside

## 2016-10-05 NOTE — ED Triage Notes (Signed)
TC to [phone]  As instructed ,no answer.

## 2016-10-05 NOTE — Progress Notes (Addendum)
CSW notified [facility] that patient was accepted to bed 306, Dr. Ardyth HarpsHernandez is the accepting physician. Call report to [phone]. Corrected report  number [phone]  Pt. may be delayed in transferring today dependent on the availability of the [place] Sheriff to transport.  Timmothy EulerJean T. Kaylyn LimSutter, MSW, LCSWA Clinical Social Work Disposition [phone]

## 2016-10-05 NOTE — ED Notes (Signed)
Of. Heffner at bedside.

## 2016-10-05 NOTE — ED Notes (Signed)
Belongings locked in cabinet and documented per procedure. Pt wakes and states he is ok and ready to go home. Explained to pt procedure for Involuntary commitment. Pt states he has been told before everyone talks down to him. Pt states he did  Not take lethal dose of gabapentin that he is aware of how much he can take without it killing him. Pt also request that he speak with supervisor. CN notified.

## 2016-10-05 NOTE — ED Triage Notes (Signed)
TC to [facility]  - to speak  Willie Ruiz the [facility] . Willie Ruiz is not able to come to phone . Left message with [name] at [facility].

## 2016-10-05 NOTE — ED Provider Notes (Signed)
Pt stable Repeat ekg performed   EKG Interpretation  Date/Time:  Thursday October 05 2016 01:22:37 EDT Ventricular Rate:  90 PR Interval:    QRS Duration: 103 QT Interval:  367 QTC Calculation: 449 R Axis:   0 Text Interpretation:  Sinus rhythm ST elevation suggests acute pericarditis Confirmed by Bebe ShaggyWICKLINE  MD, Dorinda HillNALD ([id number]) on 10/05/2016 2:06:45 AM      Poison center has called and cleared patient Psych has been consulted    Zadie Rhineonald Chirstine Defrain, MD 10/05/16 [phone]

## 2016-10-06 ENCOUNTER — Inpatient Hospital Stay
Admission: RE | Admit: 2016-10-06 | Discharge: 2016-10-08 | DRG: 881 | Disposition: A | Discharge status: Home / Self Care | Payer: Federal, State, Local not specified - Other | Source: Intra-hospital | Attending: Psychiatry | Admitting: Psychiatry

## 2016-10-06 DIAGNOSIS — Z79899 Other long term (current) drug therapy: Secondary | ICD-10-CM

## 2016-10-06 DIAGNOSIS — Z880 Allergy status to penicillin: Secondary | ICD-10-CM

## 2016-10-06 DIAGNOSIS — T428X2A Poisoning by antiparkinsonism drugs and other central muscle-tone depressants, intentional self-harm, initial encounter: Secondary | ICD-10-CM | POA: Diagnosis present

## 2016-10-06 DIAGNOSIS — F32A Depression, unspecified: Secondary | ICD-10-CM

## 2016-10-06 DIAGNOSIS — F329 Major depressive disorder, single episode, unspecified: Principal | ICD-10-CM | POA: Diagnosis present

## 2016-10-06 DIAGNOSIS — F1123 Opioid dependence with withdrawal: Secondary | ICD-10-CM | POA: Diagnosis present

## 2016-10-06 DIAGNOSIS — T426X2A Poisoning by other antiepileptic and sedative-hypnotic drugs, intentional self-harm, initial encounter: Secondary | ICD-10-CM | POA: Diagnosis present

## 2016-10-06 DIAGNOSIS — F17210 Nicotine dependence, cigarettes, uncomplicated: Secondary | ICD-10-CM | POA: Diagnosis present

## 2016-10-06 DIAGNOSIS — F17200 Nicotine dependence, unspecified, uncomplicated: Secondary | ICD-10-CM

## 2016-10-06 DIAGNOSIS — F1120 Opioid dependence, uncomplicated: Secondary | ICD-10-CM

## 2016-10-06 DIAGNOSIS — K0510 Chronic gingivitis, plaque induced: Secondary | ICD-10-CM | POA: Diagnosis present

## 2016-10-06 DIAGNOSIS — F1193 Opioid use, unspecified with withdrawal: Secondary | ICD-10-CM

## 2016-10-06 MED ORDER — CLINDAMYCIN HCL 150 MG PO CAPS
300.0000 mg | ORAL_CAPSULE | Freq: Four times a day (QID) | ORAL | Status: DC
  Administered 2016-10-06 – 2016-10-08 (×9): 300 mg via ORAL
  Filled 2016-10-06: qty 2
  Filled 2016-10-06: qty 1
  Filled 2016-10-06 (×7): qty 2

## 2016-10-06 MED ORDER — HYDROXYZINE HCL 50 MG PO TABS
50.0000 mg | ORAL_TABLET | Freq: Three times a day (TID) | ORAL | Status: DC | PRN
  Administered 2016-10-06 – 2016-10-08 (×6): 50 mg via ORAL
  Filled 2016-10-06 (×6): qty 1

## 2016-10-06 MED ORDER — NICOTINE 21 MG/24HR TD PT24
21.0000 mg | MEDICATED_PATCH | Freq: Every day | TRANSDERMAL | Status: DC | PRN
  Administered 2016-10-06: 21 mg via TRANSDERMAL
  Filled 2016-10-06 (×2): qty 1

## 2016-10-06 MED ORDER — ALUM & MAG HYDROXIDE-SIMETH 200-200-20 MG/5ML PO SUSP: 30.0000 mL | ORAL | Status: DC | PRN

## 2016-10-06 MED ORDER — CARBAMIDE PEROXIDE 10 % MT SOLN
Freq: Three times a day (TID) | OROMUCOSAL | Status: DC
  Filled 2016-10-06 (×2): qty 15

## 2016-10-06 MED ORDER — ONDANSETRON HCL 4 MG PO TABS: 4.0000 mg | ORAL_TABLET | Freq: Three times a day (TID) | ORAL | Status: DC | PRN

## 2016-10-06 MED ORDER — BENZOCAINE 10 % MT GEL
Freq: Three times a day (TID) | OROMUCOSAL | Status: DC
  Administered 2016-10-06 – 2016-10-07 (×2): via OROMUCOSAL
  Filled 2016-10-06: qty 9.4

## 2016-10-06 MED ORDER — CLINDAMYCIN HCL 300 MG PO CAPS: 300.0000 mg | ORAL_CAPSULE | Freq: Four times a day (QID) | ORAL | 0 refills | Status: DC

## 2016-10-06 MED ORDER — MAGNESIUM HYDROXIDE 400 MG/5ML PO SUSP: 30.0000 mL | Freq: Every day | ORAL | Status: DC | PRN

## 2016-10-06 MED ORDER — ACETAMINOPHEN 500 MG PO TABS
1000.0000 mg | ORAL_TABLET | Freq: Four times a day (QID) | ORAL | Status: DC | PRN
  Administered 2016-10-06 – 2016-10-07 (×2): 1000 mg via ORAL
  Filled 2016-10-06 (×2): qty 2

## 2016-10-06 MED ORDER — IBUPROFEN 800 MG PO TABS
800.0000 mg | ORAL_TABLET | Freq: Three times a day (TID) | ORAL | Status: DC
  Administered 2016-10-06 – 2016-10-08 (×7): 800 mg via ORAL
  Filled 2016-10-06 (×7): qty 1

## 2016-10-06 MED ORDER — TRAZODONE HCL 100 MG PO TABS
100.0000 mg | ORAL_TABLET | Freq: Every evening | ORAL | Status: DC | PRN
  Administered 2016-10-06 – 2016-10-07 (×2): 100 mg via ORAL
  Filled 2016-10-06 (×2): qty 1

## 2016-10-06 NOTE — ED Notes (Signed)
Pt called out, c/o dental pain. Pt requests something for pain and a hot pack.

## 2016-10-06 NOTE — BHH Counselor (Signed)
CSW attempted to meet with patient to complete psychosocial assessment. Patient was unwilling to get out of the bed, stating he wasn't feeling well and he was too tired to meet for assessment at this time. CSW attempt assessment tomorrow.   Davanta Meuser G. Garnette CzechSampson MSW, [facility] 10/06/2016 3:26 PM

## 2016-10-06 NOTE — ED Provider Notes (Signed)
4:55 AM  I was asked by Marquita PalmsMario, RN to evaluate the patient because he has a dental abscess that he thinks "ruptured". Patient is started on clindamycin. No fevers. Does have mild leukocytosis with left shift. I do not see an abscess that can be drained at this time. He has no Ludwig's angina. Normal phonation and is swallowing his secretions without difficulty. Recommended continuing alternating Tylenol and ibuprofen for pain. Recommended warm salt water gargles as needed. Recommended continuing clindamycin and when he is discharged from behavioral health he can be followed up by a dentist.   Layla MawKristen N Ward, DO 10/06/16 [phone]

## 2016-10-06 NOTE — ED Notes (Addendum)
Pt up to use restroom. Reports he believes dental abscess has ruptured and now has incr'd pain to L side of mouth. L side of pt's face continues to appear swollen. Pt spitting into sink. Saliva appears streaked with a small amount of blood and somewhat solid pus-like pieces. Foul smell on pt's breath. Contacted Ward MD if she desired to re-assess.   Coached pt to gargle saline to help clear out drainage. Will repeat again later.

## 2016-10-06 NOTE — Tx Team (Signed)
Initial Treatment Plan 10/06/2016 5:00 PM Willie PenningBrandon A Row YQM:[id number]    PATIENT STRESSORS: Educational concerns Financial difficulties Marital or family conflict Substance abuse   PATIENT STRENGTHS: Average or above average intelligence Communication skills Motivation for treatment/growth Supportive family/friends   PATIENT IDENTIFIED PROBLEMS: Substance abuse mood disorder 10/06/16  Drug overdose 10/06/2016                   DISCHARGE CRITERIA:  Ability to meet basic life and health needs Adequate post-discharge living arrangements Verbal commitment to aftercare and medication compliance Withdrawal symptoms are absent or subacute and managed without 24-hour nursing intervention  PRELIMINARY DISCHARGE PLAN: Attend aftercare/continuing care group Outpatient therapy Return to previous living arrangement  PATIENT/FAMILY INVOLVEMENT: This treatment plan has been presented to and reviewed with the patient, Willie Ruiz, and/or family member, The patient and family have been given the opportunity to ask questions and make suggestions.  Leonarda SalonGigi George Stark Aguinaga, RN 10/06/2016, 5:00 PM

## 2016-10-06 NOTE — Progress Notes (Signed)
Patient admitted in the unit.Patient was little hesitant to cooperate with the admission process.But cooperated with encouragement.Patient verbalized that his over dose was not suicidal.Skin assessment & body search done.No contraband found.Denies suicidal or homicidal ideations & AV hallucinations.Patient refused to lie down in bed even though he said he is tired.As soon as staff left the room heard a noise & found patient on the floor.Patient stated "I got dizzy & hit my bud.I am OK".Helped the patient to get up and patient walked to the bed.No pain verbalized.Encouraged fluids.Made patient comfortable in bed.

## 2016-10-06 NOTE — Progress Notes (Signed)
This Clinical research associatewriter received report on 10/05/16  for [facility], but [employer] transportation was not available until the morning of 10/06/16. Bed 306 A is being held pending pt arrival.

## 2016-10-06 NOTE — ED Notes (Signed)
Pt to desk for phone call.

## 2016-10-06 NOTE — BHH Counselor (Signed)
Called nurse Marquita PalmsMario for an update on [facility]. Per Marquita PalmsMario, he had the wrong number for calling in report but obtained the correct number. Marquita PalmsMario sts he called report in for pt. Also, Hillery AldoMario sts that [place] [employer] [place] not transport pts overnight but he has made arrangements through [employer] to transport pt in the morning.   Beryle FlockMary Anissia Wessells, MS, CRC, [facility] Triage Specialist [facility]

## 2016-10-06 NOTE — H&P (Addendum)
Psychiatric Admission Assessment Adult  Patient Identification: Willie Ruiz MRN:  [id number] Date of Evaluation:  10/06/2016 Chief Complaint: overdose Principal Diagnosis: Depressive disorder Diagnosis:   Patient Active Problem List   Diagnosis Date Noted  . Opioid use disorder, severe, dependence ([place]) [F11.20] 10/06/2016  . Opiate withdrawal ([place]) [F11.23] 10/06/2016  . Tobacco use disorder [F17.200] 10/06/2016  . Gingivitis [K05.10] 10/06/2016  . Unspecified depressive disorder [F32.9] 10/06/2016   History of Present Illness:   Willie Ruiz is a 31 y.o. male. He came to [facility] emergency department on March 15 in the company of his mother after an overdose.  He took "2 handfuls of pills".  His mother thinks he took 20 300 mg gabapentin, 8-10 750 mg Robaxin, and a couple of Phenergan 25 mg tablets.  The patient was vague about why he took this medicine, and stated "I want to see how they tasted".  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  He went to a local ED few days ago [facility]), and was prescribed the medicines that he overdosed on, as treatment for withdrawal from oxycodone.  He was given outpatient resources, but did not choose to use them.    Patient was brought in  by substance abuse counselor following his first home visit when the patient took a handful of gabapentin after an argument with mom. Pt says he knew what the lethal dose was and he didn't take enough to kill himself and he also says he spit most of it out. Pt says his intentions were not to hurt himself and that he was "just trying to make a point" to his mom and "make her shut up". He reports no suicidal ideations, states he is happy, does not want to die, be in a coma or anything of the sort. No hx of cutting, burning, or self-harm. He states he wants to live life happily and he is aware that he made "stupid decision" at the  time. Pt denies HI or auditory/visual hallucinations. He has no previous psychiatric history or any other medical problems. Currently he reports being in pain secondary to a tooth abscess that randomly bleeds and expels dried pus.   For the past few months patient has been abusing Oxycodone after he became involved with some "unfavorable people". He says he used it whenever he had the money for it. He was aware he was making "dumb decisions" but continued using for some reason. Last Oxycodone use was on 10/01/16, he decided he wanted to quit after because "it wasn't worth it" anymore. He does smoke 1/2 pack cigs every day, occasional marijuana use- last use was over the weekend, last cocaine use was years ago, and admitted to trying heroin approximately 6 months ago. Denies use of alcohol, heroin, or other substances. Patient does not think/know about family's psych or medical history.  Pt is single, girlfriend and he broke up on Monday 10/02/16 after she found out about his Oxycodone abuse. However, pt states they are still in contact, love each other, and working things out. He also lost his job at [employer] on Monday following a personal conversation with boss about enrolling in rehab for his substance abuse. Never been married, no children. Has not had any trouble with the law, became tearful when he said he wanted to go into law enforcement but due to this involuntary admit he might not be able to  anymore. No previous hospitalizations. No history of physical, emotional, sexual abuse or hx of domestic violence exposure. Does not take other medications other than what he's being given in our unit. Allergic to penicillin.   Patient's goals are to get clean, safe, and be discharged. States his ex-girlfriend has no idea where he is, has not had any contact with her, doesn't remember her number by heart so can't call either. He would like to be discharged so he can work things out with her. He  also states that he will continue to see his substance abuse counselor upon discharge.   Associated Signs/Symptoms: Depression Symptoms:  denies (Hypo) Manic Symptoms:  denies Anxiety Symptoms:  denies Psychotic Symptoms:  denies PTSD Symptoms: Negative Total Time spent with patient: 1 hour  Past Psychiatric History: Pt reports no previous psychiatric diagnosis or history of psychiatric problems.  Is the patient at risk to self? Yes.    Has the patient been a risk to self in the past 6 months? No.  Has the patient been a risk to self within the distant past? No.  Is the patient a risk to others? No.  Has the patient been a risk to others in the past 6 months? No.  Has the patient been a risk to others within the distant past? No.    Past Medical History: History reviewed. No pertinent past medical history. History reviewed. No pertinent surgical history.  Family History: History reviewed. No pertinent family history. Patient doesn't know of family's medical history.  Family Psychiatric  History: Pt doesn't think anyone in the family suffers from mental illnesses or substance abuse problems.  Tobacco Screening: Have you used any form of tobacco in the last 30 days? (Cigarettes, Smokeless Tobacco, Cigars, and/or Pipes): Yes Tobacco use, Select all that apply: 5 or more cigarettes per day Are you interested in Tobacco Cessation Medications?: Yes, will notify MD for an order Counseled patient on smoking cessation including recognizing danger situations, developing coping skills and basic information about quitting provided: Yes1/2 pack/day  Social History: Single, never married, doesn't have any children, and denies any legal history. Lives with his mother.  History  Alcohol Use  . Yes     History  Drug Use    Comment: oxycodone abuse    Additional Social History:    Allergies:   Allergies  Allergen Reactions  . Penicillins Hives and Swelling   Lab Results:  Results for  orders placed or performed during the hospital encounter of 10/04/16 (from the past 48 hour(s))  Urinalysis, Routine w reflex microscopic     Status: Abnormal   Collection Time: 10/04/16  7:42 PM  Result Value Ref Range   Color, Urine YELLOW YELLOW   APPearance CLEAR CLEAR   Specific Gravity, Urine 1.019 1.005 - 1.030   pH 5.0 5.0 - 8.0   Glucose, UA NEGATIVE NEGATIVE mg/dL   Hgb urine dipstick NEGATIVE NEGATIVE   Bilirubin Urine SMALL (A) NEGATIVE   Ketones, ur NEGATIVE NEGATIVE mg/dL   Protein, ur NEGATIVE NEGATIVE mg/dL   Nitrite NEGATIVE NEGATIVE   Leukocytes, UA NEGATIVE NEGATIVE  Urine rapid drug screen (hosp performed)     Status: Abnormal   Collection Time: 10/04/16  7:42 PM  Result Value Ref Range   Opiates POSITIVE (A) NONE DETECTED   Cocaine NONE DETECTED NONE DETECTED   Benzodiazepines POSITIVE (A) NONE DETECTED   Amphetamines NONE DETECTED NONE DETECTED   Tetrahydrocannabinol POSITIVE (A) NONE DETECTED   Barbiturates NONE DETECTED NONE  DETECTED    Comment:        DRUG SCREEN FOR MEDICAL PURPOSES ONLY.  IF CONFIRMATION IS NEEDED FOR ANY PURPOSE, NOTIFY LAB WITHIN 5 DAYS.        LOWEST DETECTABLE LIMITS FOR URINE DRUG SCREEN Drug Class       Cutoff (ng/mL) Amphetamine      1000 Barbiturate      200 Benzodiazepine   161 Tricyclics       096 Opiates          300 Cocaine          300 THC              50   Comprehensive metabolic panel     Status: Abnormal   Collection Time: 10/04/16  8:18 PM  Result Value Ref Range   Sodium 138 135 - 145 mmol/L   Potassium 3.6 3.5 - 5.1 mmol/L   Chloride 101 101 - 111 mmol/L   CO2 30 22 - 32 mmol/L   Glucose, Bld 89 65 - 99 mg/dL   BUN 6 6 - 20 mg/dL   Creatinine, Ser 1.24 0.61 - 1.24 mg/dL   Calcium 9.1 8.9 - 10.3 mg/dL   Total Protein 7.6 6.5 - 8.1 g/dL   Albumin 4.4 3.5 - 5.0 g/dL   AST 15 15 - 41 U/L   ALT 13 (L) 17 - 63 U/L   Alkaline Phosphatase 71 38 - 126 U/L   Total Bilirubin 1.0 0.3 - 1.2 mg/dL   GFR calc  non Af Amer >60 >60 mL/min   GFR calc Af Amer >60 >60 mL/min    Comment: (NOTE) The eGFR has been calculated using the CKD EPI equation. This calculation has not been validated in all clinical situations. eGFR's persistently <60 mL/min signify possible Chronic Kidney Disease.    Anion gap 7 5 - 15  CBC with Differential     Status: Abnormal   Collection Time: 10/04/16  8:18 PM  Result Value Ref Range   WBC 11.3 (H) 4.0 - 10.5 K/uL   RBC 5.39 4.22 - 5.81 MIL/uL   Hemoglobin 16.4 13.0 - 17.0 g/dL   HCT 48.3 39.0 - 52.0 %   MCV 89.6 78.0 - 100.0 fL   MCH 30.4 26.0 - 34.0 pg   MCHC 34.0 30.0 - 36.0 g/dL   RDW 13.2 11.5 - 15.5 %   Platelets 255 150 - 400 K/uL   Neutrophils Relative % 70 %   Neutro Abs 7.9 (H) 1.7 - 7.7 K/uL   Lymphocytes Relative 21 %   Lymphs Abs 2.4 0.7 - 4.0 K/uL   Monocytes Relative 7 %   Monocytes Absolute 0.8 0.1 - 1.0 K/uL   Eosinophils Relative 2 %   Eosinophils Absolute 0.2 0.0 - 0.7 K/uL   Basophils Relative 0 %   Basophils Absolute 0.0 0.0 - 0.1 K/uL  Salicylate level     Status: None   Collection Time: 10/04/16  8:18 PM  Result Value Ref Range   Salicylate Lvl <0.4 2.8 - 30.0 mg/dL  Acetaminophen level     Status: Abnormal   Collection Time: 10/04/16  8:18 PM  Result Value Ref Range   Acetaminophen (Tylenol), Serum <10 (L) 10 - 30 ug/mL    Comment:        THERAPEUTIC CONCENTRATIONS VARY SIGNIFICANTLY. A RANGE OF 10-30 ug/mL MAY BE AN EFFECTIVE CONCENTRATION FOR MANY PATIENTS. HOWEVER, SOME ARE BEST TREATED AT CONCENTRATIONS OUTSIDE THIS RANGE. ACETAMINOPHEN CONCENTRATIONS >  150 ug/mL AT 4 HOURS AFTER INGESTION AND >50 ug/mL AT 12 HOURS AFTER INGESTION ARE OFTEN ASSOCIATED WITH TOXIC REACTIONS.   Ethanol     Status: None   Collection Time: 10/04/16  8:18 PM  Result Value Ref Range   Alcohol, Ethyl (B) <5 <5 mg/dL    Comment:        LOWEST DETECTABLE LIMIT FOR SERUM ALCOHOL IS 5 mg/dL FOR MEDICAL PURPOSES ONLY   I-Stat Troponin, ED  (not at [facility])     Status: None   Collection Time: 10/05/16  2:24 PM  Result Value Ref Range   Troponin i, poc 0.00 0.00 - 0.08 ng/mL   Comment 3            Comment: Due to the release kinetics of cTnI, a negative result within the first hours of the onset of symptoms does not rule out myocardial infarction with certainty. If myocardial infarction is still suspected, repeat the test at appropriate intervals.     Blood Alcohol level:  Lab Results  Component Value Date   ETH <5 10/04/2016   ETH 219 (H) 46/27/0350    Metabolic Disorder Labs:  No results found for: HGBA1C, MPG No results found for: PROLACTIN No results found for: CHOL, TRIG, HDL, CHOLHDL, VLDL, LDLCALC  Current Medications: Current Facility-Administered Medications  Medication Dose Route Frequency Provider Last Rate Last Dose  . acetaminophen (TYLENOL) tablet 1,000 mg  1,000 mg Oral Q6H PRN Hildred Priest, MD      . alum & mag hydroxide-simeth (MAALOX/MYLANTA) 200-200-20 MG/5ML suspension 30 mL  30 mL Oral Q4H PRN Hildred Priest, MD      . benzocaine (ORAJEL) 10 % mucosal gel   Mouth/Throat TID AC & HS Hildred Priest, MD      . carbamide peroxide (GLY-OXIDE) 10 % mouth solution   dental TID PC & HS Hildred Priest, MD      . clindamycin (CLEOCIN) capsule 300 mg  300 mg Oral Q6H Hildred Priest, MD   300 mg at 10/06/16 1220  . hydrOXYzine (ATARAX/VISTARIL) tablet 50 mg  50 mg Oral TID PRN Hildred Priest, MD   50 mg at 10/06/16 1151  . ibuprofen (ADVIL,MOTRIN) tablet 800 mg  800 mg Oral TID Hildred Priest, MD   800 mg at 10/06/16 1220  . magnesium hydroxide (MILK OF MAGNESIA) suspension 30 mL  30 mL Oral Daily PRN Hildred Priest, MD      . nicotine (NICODERM CQ - dosed in mg/24 hours) patch 21 mg  21 mg Transdermal Daily PRN Hildred Priest, MD      . ondansetron [facility]) tablet 4 mg  4 mg Oral Q8H PRN Hildred Priest, MD      . traZODone (DESYREL) tablet 100 mg  100 mg Oral QHS PRN Hildred Priest, MD       PTA Medications: Prescriptions Prior to Admission  Medication Sig Dispense Refill Last Dose  . clindamycin (CLEOCIN) 300 MG capsule Take 1 capsule (300 mg total) by mouth 4 (four) times daily. X 7 days 28 capsule 0   . clonazePAM (KLONOPIN) 1 MG tablet Take 1 mg by mouth 2 (two) times daily. Take 1 mg by mouth 4 times daily for 1 day, then take 1 mg by mouth 3 times daily for 1 day, then 0.5 mg by mouth 3 times daily   Unknown at Unknown  . gabapentin (NEURONTIN) 300 MG capsule Take 300 mg by mouth 3 (three) times daily.   Unknown at Unknown  .  methocarbamol (ROBAXIN) 750 MG tablet Take 1,500 mg by mouth 3 (three) times daily.   Unknown at Unknown  . promethazine (PHENERGAN) 25 MG tablet Take 25-50 mg by mouth every 6 (six) hours as needed for nausea or vomiting.   Unknown at Unknown    Musculoskeletal: Strength & Muscle Tone: within normal limits Gait & Station: normal Patient leans: N/A  Psychiatric Specialty Exam: Physical Exam  Constitutional: He is oriented to person, place, and time. He appears well-developed and well-nourished.  HENT:  Head: Normocephalic and atraumatic.  Eyes: EOM are normal.  Neck: Normal range of motion.  Respiratory: Effort normal.  Musculoskeletal: Normal range of motion.  Neurological: He is alert and oriented to person, place, and time.    Review of Systems  Constitutional: Negative.   HENT: Negative.   Eyes: Negative.   Respiratory: Negative.   Cardiovascular: Negative.   Gastrointestinal: Negative.   Genitourinary: Negative.   Musculoskeletal: Negative.   Skin: Negative.   Neurological: Negative.   Endo/Heme/Allergies: Negative.   Psychiatric/Behavioral: Positive for substance abuse. Negative for depression, hallucinations, memory loss and suicidal ideas. The patient is not nervous/anxious and does not have insomnia.      Blood pressure 109/65, pulse 97, temperature 97.8 F (36.6 C), temperature source Oral, resp. rate 18, height 6' (1.829 m), weight 81.6 kg (180 lb), SpO2 100 %.Body mass index is 24.41 kg/m.  General Appearance: Fairly Groomed  Eye Contact:  Good  Speech:  Clear and Coherent and Normal Rate  Volume:  Normal  Mood:  Euthymic  Affect:  Appropriate  Thought Process:  Linear  Orientation:  Full (Time, Place, and Person)  Thought Content:  Logical  Suicidal Thoughts:  No  Homicidal Thoughts:  No  Memory:  Immediate;   Good Recent;   Good Remote;   Good  Judgement:  Intact  Insight:  Good  Psychomotor Activity:  Normal  Concentration:  Concentration: Good and Attention Span: Good  Recall:  [place] of Knowledge:  Good  Language:  Good  Akathisia:  No  Handed:    AIMS (if indicated):     Assets:  Communication Skills Desire for Improvement Social Support  ADL's:  Intact  Cognition:  WNL  Sleep:       Treatment Plan Summary: Daily contact with patient to assess and evaluate symptoms and progress in treatment   31 year old Caucasian male with significant history of substance abuse who was admitted to our facility after an intentional overdose. The patient claims it was not a suicidal attempt he was just trying to get his mother angry.  Patient became uncooperative after he learned that he was not going to be discharged today.  Unspecified depressive disorder: Patient claims he is not having any depressive symptoms. His mother feels that the patient has been depressed due to his loss of job, breakup with girlfriend and ongoing substance use issues. Patient was uncooperative and was unable to fully assess.  Insomnia I will order trazodone 100 mg by mouth by mouth daily at bedtime when necessary  Dental abscess/gingivitis patient has been started on clindamycin 300 mg every 6 hours. He has orders for ibuprofen, Tylenol and Orajel  Opiate withdrawal: Patient has orders for  Zofran an imodium when necessary,.  Will treat symptomatically.  Tobacco use disorder we will order nicotine patch 21 mg a day  Diet regular  Precautions every 15 minute checks  Hospitalization and status involuntary commitment  Vital signs daily  Will need collateral information from his  mother  Follow-up: To be determined. Patient was not cooperative with Education officer, museum  Dispo: unclear.   Observation Level/Precautions:  Fall 15 minute checks  Laboratory:  CBC Chemistry Profile UA  Psychotherapy:    Medications:    Consultations:    Discharge Concerns:    Estimated LOS:  Other:     Physician Treatment Plan for Primary Diagnosis: Depressive disorder Long Term Goal(s): Improvement in symptoms so as ready for discharge  Short Term Goals: Ability to identify changes in lifestyle to reduce recurrence of condition will improve and Ability to identify triggers associated with substance abuse/mental health issues will improve  Physician Treatment Plan for Secondary Diagnosis: Principal Problem:   Unspecified depressive disorder Active Problems:   Opioid use disorder, severe, dependence ([place])   Opiate withdrawal ([place])   Tobacco use disorder   Gingivitis  Long Term Goal(s): Improvement in symptoms so as ready for discharge  Short Term Goals: Ability to identify changes in lifestyle to reduce recurrence of condition will improve, Ability to demonstrate self-control will improve and Ability to identify triggers associated with substance abuse/mental health issues will improve  I certify that inpatient services furnished can reasonably be expected to improve the patient's condition.    Hildred Priest, MD 3/16/20183:10 PM

## 2016-10-06 NOTE — Progress Notes (Signed)
Patient verbalized no pain or sore from the fall.Denies dizzy now.Ambulating in the hall way with out any problem.

## 2016-10-06 NOTE — ED Notes (Signed)
[facility] called and made aware pt is on his way.  Sheriff's dept. Here to transport pt and belongings provided to sheriff.

## 2016-10-06 NOTE — ED Notes (Signed)
ED Provider at bedside. 

## 2016-10-07 MED ORDER — TRAZODONE HCL 50 MG PO TABS
ORAL_TABLET | ORAL | Status: AC
  Filled 2016-10-07: qty 2

## 2016-10-07 NOTE — Progress Notes (Signed)
Patient was in the milieu. Complaining of his tooth pain. Received medications as prescribed. Was seen in the dayroom but often isolative in room. Anxious and irritable. Currently in bed sleeping. Staff continue to monitor.

## 2016-10-07 NOTE — [facility] Counselor (Signed)
Adult Comprehensive Assessment  Patient ID: Willie Ruiz, male   DOB: 07/15/1986, 31 y.o.   MRN: [id number]  Information Source: Information source: Patient  Current Stressors:  Educational / Learning stressors: n/a Employment / Job issues: Lost his Job 09/29/2016 Family Relationships: n/a Surveyor, quantityinancial / Lack of resources (include bankruptcy): n/a Housing / Lack of housing: n/a Physical health (include injuries & life threatening diseases): n/a Social relationships: n/a Substance abuse: Oxycodone abuse Bereavement / Loss: Patient states he lost 4 of his high school classmates last week to overdose.   Living/Environment/Situation:  Living Arrangements: Other relatives Living conditions (as described by patient or guardian): Pt states it is fine. How long has patient lived in current situation?: "A few years" What is atmosphere in current home: Comfortable, ParamedicLoving, Supportive  Family History:  Marital status: Single Are you sexually active?: Yes What is your sexual orientation?: heterosexual Has your sexual activity been affected by drugs, alcohol, medication, or emotional stress?: n/a Does patient have children?: No  Childhood History:  By whom was/is the patient raised?: Mother/father and step-parent Additional childhood history information: n/a Description of patient's relationship with caregiver when they were a child: Pt states "It was pretty good".  Patient's description of current relationship with people who raised him/her: Patient states he is close with both of his parents but feel he is closer to his dad.  How were you disciplined when you got in trouble as a child/adolescent?: n/a Does patient have siblings?: Yes Number of Siblings: 3 Description of patient's current relationship with siblings: Patient states he has 1 full brother and 2 half sisters. Has a close relationship with his siblings.  Did patient suffer any verbal/emotional/physical/sexual abuse as a child?:  No Did patient suffer from severe childhood neglect?: No Has patient ever been sexually abused/assaulted/raped as an adolescent or adult?: No Was the patient ever a victim of a crime or a disaster?: No Witnessed domestic violence?: No Has patient been effected by domestic violence as an adult?: No  Education:  Highest grade of school patient has completed: 10th Currently a student?: No Name of school: n/a Learning disability?: Yes What learning problems does patient have?: Patient states he had ADHD and struggled with paying attention in classes.   Employment/Work Situation:   Employment situation: Unemployed Patient's job has been impacted by current illness: Yes Describe how patient's job has been impacted: Patient states his oxycodone abuse made it difficult to work.  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 )  Financial Resources:   Financial resources: Support from parents / caregiver (Support from his brother who he lives with. ) Does patient have a Lawyerrepresentative payee or guardian?: No  Alcohol/Substance Abuse:   What has been your use of drugs/alcohol within the last 12 months?: Oxycodone If attempted suicide, did drugs/alcohol play a role in this?: Yes Alcohol/Substance Abuse Treatment Hx: Denies past history Has alcohol/substance abuse ever caused legal problems?: No  Social Support System:   Patient's Community Support System: Good Describe Community Support System: Patients states he has support from his parents and siblings.  Type of faith/religion:  n/a How does patient's faith  help to cope with current illness?: n/a  Leisure/Recreation:   Leisure and Hobbies: Patient states he enjoys playing video games, motor sports, race four wheelers.   Strengths/Needs:   What things does the patient do well?: hard worker, communication, family oriented.  In what areas does patient struggle / problems for patient: oxycodone abuse, anxiety, stress management  Discharge Plan:   Does patient have access to transportation?: Yes Will patient be returning to same living situation after discharge?: Yes Currently receiving community mental health services: No If no, would patient like referral for services when discharged?: Yes (What county?) [facility]) Does patient have financial barriers related to discharge medications?: No (Patient has financial assistance from parents.)  Summary/Recommendations:   Patient is a 31 year old male admitted involuntarily with a diagnosis of Opioid use disorder, severe, dependence. Information was obtained from psychosocial assessment completed with patient and chart review conducted by this evaluator. Patient presented to the hospital after overdose on medications. Patient reports primary triggers for admission were a disagreement with his mother and recently being fired from his job. Patient has support from parents and brother. Patient will benefit from crisis stabilization, medication evaluation, group therapy and psycho education in addition to case management for discharge. At discharge, it is recommended that patient remain compliant with established discharge plan and continued treatment.   Lamees Gable G. Garnette Czech MSW, [facility] 10/07/2016 3:09 PM

## 2016-10-07 NOTE — Plan of Care (Signed)
Problem: Coping: Goal: Ability to verbalize frustrations and anger appropriately will improve Outcome: Progressing Pt able to express her feelings and thoughts

## 2016-10-07 NOTE — Plan of Care (Signed)
Problem: Education: Goal: Emotional status will improve Outcome: Progressing Pleasant and cooperative     

## 2016-10-07 NOTE — Plan of Care (Signed)
Problem: Coping: Goal: Ability to verbalize frustrations and anger appropriately will improve Outcome: Progressing Verbalizing her concerns appropriately

## 2016-10-07 NOTE — Progress Notes (Signed)
[facility] MD Progress Note  10/07/2016 3:41 PM Willie Ruiz  MRN:  [id number] Subjective:  Patient seen and chart reviewed. Patient states that he is feeling better today. He says he is physically feeling okay. Slept adequately last night. Denies having any suicidal thoughts at all. Denies any hallucinations. He is neatly dressed and groomed. Does not appear to be in any physical distress. Full range affect. Totally denies suicidal thoughts. Principal Problem: Depressive disorder Diagnosis:   Patient Active Problem List   Diagnosis Date Noted  . Opioid use disorder, severe, dependence (HCC) [F11.20] 10/06/2016  . Opiate withdrawal (HCC) [F11.23] 10/06/2016  . Tobacco use disorder [F17.200] 10/06/2016  . Gingivitis [K05.10] 10/06/2016  . Unspecified depressive disorder [F32.9] 10/06/2016   Total Time spent with patient: 30 minutes  Past Psychiatric History: Patient has a history of opiate abuse no history of actual suicide attempts in the past. Has been prescribed medications in the past for substance abuse and anxiety.  Past Medical History: History reviewed. No pertinent past medical history. History reviewed. No pertinent surgical history. Family History: History reviewed. No pertinent family history. Family Psychiatric  History: Negative Social History:  History  Alcohol Use  . Yes     History  Drug Use    Comment: oxycodone abuse    Social History   Social History  . Marital status: Single    Spouse name: N/A  . Number of children: N/A  . Years of education: N/A   Social History Main Topics  . Smoking status: Current Every Day Smoker    Types: Cigarettes  . Smokeless tobacco: Never Used  . Alcohol use Yes  . Drug use: Yes     Comment: oxycodone abuse  . Sexual activity: Not Asked   Other Topics Concern  . None   Social History Narrative  . None   Additional Social History:    History of alcohol / drug use?: Yes Negative Consequences of Use: Financial, Personal  relationships, Work / School Withdrawal Symptoms: Blackouts, Irritability                    Sleep: Fair  Appetite:  Good  Current Medications: Current Facility-Administered Medications  Medication Dose Route Frequency Provider Last Rate Last Dose  . acetaminophen (TYLENOL) tablet 1,000 mg  1,000 mg Oral Q6H PRN Jimmy Footman, MD   1,000 mg at 10/07/16 0837  . alum & mag hydroxide-simeth (MAALOX/MYLANTA) 200-200-20 MG/5ML suspension 30 mL  30 mL Oral Q4H PRN Jimmy Footman, MD      . benzocaine (ORAJEL) 10 % mucosal gel   Mouth/Throat TID AC & HS Jimmy Footman, MD      . carbamide peroxide (GLY-OXIDE) 10 % mouth solution   dental TID PC & HS Jimmy Footman, MD      . clindamycin (CLEOCIN) capsule 300 mg  300 mg Oral Q6H Jimmy Footman, MD   300 mg at 10/07/16 1218  . hydrOXYzine (ATARAX/VISTARIL) tablet 50 mg  50 mg Oral TID PRN Jimmy Footman, MD   50 mg at 10/07/16 0837  . ibuprofen (ADVIL,MOTRIN) tablet 800 mg  800 mg Oral TID Jimmy Footman, MD   800 mg at 10/07/16 1218  . magnesium hydroxide (MILK OF MAGNESIA) suspension 30 mL  30 mL Oral Daily PRN Jimmy Footman, MD      . nicotine (NICODERM CQ - dosed in mg/24 hours) patch 21 mg  21 mg Transdermal Daily PRN Jimmy Footman, MD   21 mg at 10/06/16 1743  .  ondansetron (ZOFRAN) tablet 4 mg  4 mg Oral Q8H PRN Jimmy FootmanAndrea Hernandez-Gonzalez, MD      . traZODone (DESYREL) tablet 100 mg  100 mg Oral QHS PRN Jimmy FootmanAndrea Hernandez-Gonzalez, MD   100 mg at 10/06/16 2132    Lab Results: No results found for this or any previous visit (from the past 48 hour(s)).  Blood Alcohol level:  Lab Results  Component Value Date   ETH <5 10/04/2016   ETH 219 (H) 01/02/2014    Metabolic Disorder Labs: No results found for: HGBA1C, MPG No results found for: PROLACTIN No results found for: CHOL, TRIG, HDL, CHOLHDL, VLDL, LDLCALC  Physical  Findings: AIMS:  , ,  ,  ,    CIWA:    COWS:  COWS Total Score: 3  Musculoskeletal: Strength & Muscle Tone: within normal limits Gait & Station: normal Patient leans: N/A  Psychiatric Specialty Exam: Physical Exam  Nursing note and vitals reviewed. Constitutional: He appears well-developed and well-nourished.  HENT:  Head: Normocephalic and atraumatic.  Eyes: Conjunctivae are normal. Pupils are equal, round, and reactive to light.  Neck: Normal range of motion.  Cardiovascular: Regular rhythm and normal heart sounds.   Respiratory: Effort normal. No respiratory distress.  GI: Soft.  Musculoskeletal: Normal range of motion.  Neurological: He is alert.  Skin: Skin is warm and dry.  Psychiatric: His behavior is normal. Thought content normal. His mood appears not anxious. His speech is not delayed. Cognition and memory are normal. He expresses impulsivity.    Review of Systems  Constitutional: Negative.   HENT: Negative.   Eyes: Negative.   Respiratory: Negative.   Cardiovascular: Negative.   Gastrointestinal: Negative.   Musculoskeletal: Negative.   Skin: Negative.   Neurological: Negative.   Psychiatric/Behavioral: Positive for substance abuse. Negative for depression, hallucinations, memory loss and suicidal ideas. The patient is not nervous/anxious and does not have insomnia.     Blood pressure 132/88, pulse 90, temperature 98.3 F (36.8 C), temperature source Oral, resp. rate 18, height 6' (1.829 m), weight 81.6 kg (180 lb), SpO2 100 %.Body mass index is 24.41 kg/m.  General Appearance: Fairly Groomed  Eye Contact:  Good  Speech:  Clear and Coherent  Volume:  Normal  Mood:  Euthymic  Affect:  Congruent  Thought Process:  Goal Directed  Orientation:  Full (Time, Place, and Person)  Thought Content:  Logical  Suicidal Thoughts:  No  Homicidal Thoughts:  No  Memory:  Immediate;   Good Recent;   Fair Remote;   Fair  Judgement:  Fair  Insight:  Fair  Psychomotor  Activity:  Decreased  Concentration:  Concentration: Fair  Recall:  [employer]  Fund of Knowledge:  Fair  Language:  Fair  Akathisia:  No  Handed:  Right  AIMS (if indicated):     Assets:  Desire for Improvement Financial Resources/Insurance Housing Physical Health  ADL's:  Intact  Cognition:  WNL  Sleep:        Treatment Plan Summary: Plan 31 year old man who took an overdose impulsively during substance abuse. Has consistently denied suicidal ideation. Patient's affect is full range and upbeat today. Totally denies suicidal ideation. Does not appear to be delirious. No sign of psychosis. Patient is requesting to be released from the hospital sooner rather than later. He has received education about substance abuse treatment. I will reassess him tomorrow and we can consider whether we may be able to get him discharged from the hospital. Supportive counseling and no change to medicine  today.  Mordecai Rasmussen, MD 10/07/2016, 3:41 PM

## 2016-10-07 NOTE — [facility] Suicide Risk Assessment (Signed)
[facility] INPATIENT:  Family/Significant Other Suicide Prevention Education  Suicide Prevention Education:  Education Completed;Willie Ruiz(brother [phone]), has been identified by the patient as the family member/significant other with whom the patient will be residing, and identified as the person(s) who will aid the patient in the event of a mental health crisis (suicidal ideations/suicide attempt).  With written consent from the patient, the family member/significant other has been provided the following suicide prevention education, prior to the and/or following the discharge of the patient.  The suicide prevention education provided includes the following:  Suicide risk factors  Suicide prevention and interventions  National Suicide Hotline telephone number  [facility] assessment telephone number  [facility] Emergency Assistance 911  [facility] and/or Residential Mobile Crisis Unit telephone number  Request made of family/significant other to:  Remove weapons (e.g., guns, rifles, knives), all items previously/currently identified as safety concern.    Remove drugs/medications (over-the-counter, prescriptions, illicit drugs), all items previously/currently identified as a safety concern.  The family member/significant other verbalizes understanding of the suicide prevention education information provided.  The family member/significant other agrees to remove the items of safety concern listed above.  Willie Ruiz MSW, LCSWA 10/07/2016, 3:35 PM

## 2016-10-07 NOTE — [facility] Group Notes (Signed)
[facility] LCSW Group Therapy  10/07/2016 2:26 PM  Type of Therapy:  Group Therapy  Participation Level:  Active  Participation Quality:  Attentive  Affect:  Appropriate  Cognitive:  Alert  Insight:  Developing/Improving  Engagement in Therapy:  Distracting  Modes of Intervention:  Discussion, Education, Reality Testing, Socialization and Support  Summary of Progress/Problems: Goal Setting: The objective is to set goals as they relate to the crisis in which they were admitted. Patients will be using SMART goal modalities to set measurable goals. Characteristics of realistic goals will be discussed and patients will be assisted in setting and processing how one will reach their goal. Facilitator will also assist patients in applying interventions and coping skills learned in psycho-education groups to the SMART goal and process how one will achieve defined goal.  Lennard Capek G. Garnette CzechSampson MSW, LCSWA 10/07/2016, 2:26 PM

## 2016-10-07 NOTE — Progress Notes (Signed)
Patient is pleasant ,cooperative less anxious than yesterday.Rated his depression 0/10 and anxiety 4/10.Denies suicidal or homicidal ideations & AV hallucinations.Stated that his goal for today is staying positive.Appropriate with staff & peers.Compliant with medications.Appetite & energy level good.Support & encouragement given.

## 2016-10-07 NOTE — Plan of Care (Signed)
Problem: Coping: Goal: Ability to verbalize frustrations and anger appropriately will improve Outcome: Progressing Pleasant and expressing  Her feelings appropriately

## 2016-10-07 NOTE — Plan of Care (Signed)
Problem: Safety: Goal: Periods of time without injury will increase Outcome: Progressing No self harm behavior   

## 2016-10-08 MED ORDER — CLINDAMYCIN HCL 300 MG PO CAPS: 300.0000 mg | ORAL_CAPSULE | Freq: Four times a day (QID) | ORAL | 0 refills | Status: AC

## 2016-10-08 MED ORDER — TRAZODONE HCL 100 MG PO TABS: 100.0000 mg | ORAL_TABLET | Freq: Every evening | ORAL | 0 refills | Status: AC | PRN

## 2016-10-08 NOTE — Progress Notes (Signed)
D: Patient was observed reclining in bed and stated he was "just resting and not avoiding other patients".  He attended wrap up group and spent time with peers watching ball games.  He stated his mouth felt better and refused the orogel and mouthwash.  He had Trazadone for sleep at 2200 and was awake at 2330.  He was given Vistaril at that time per his request.  He denies thoughts of self harm or Auditory Hallucinations. A:  Patient Education on medications and supportive listening were given. R:  Continue to monitor and assess.

## 2016-10-08 NOTE — Discharge Summary (Signed)
Physician Discharge Summary Note  Patient:  Willie Ruiz is an 31 y.o., male MRN:  [id number] DOB:  [birth year] Patient phone:  [phone] (home)  Patient address:   [address] [place] [address],  Total Time spent with patient: 45 minutes  Date of Admission:  10/06/2016 Date of Discharge: 10/08/2016  Reason for Admission:  Patient was admitted to the hospital for further stabilization after an overdose while intoxicated on narcotics. Patient has completed treatment for detox. He has been stabilized and evaluated psychiatrically and in group therapy.  Principal Problem: Depressive disorder Discharge Diagnoses: Patient Active Problem List   Diagnosis Date Noted  . Opioid use disorder, severe, dependence (HCC) [F11.20] 10/06/2016  . Opiate withdrawal (HCC) [F11.23] 10/06/2016  . Tobacco use disorder [F17.200] 10/06/2016  . Gingivitis [K05.10] 10/06/2016  . Unspecified depressive disorder [F32.9] 10/06/2016    Past Psychiatric History: Patient has a history of substance abuse but no prior suicide attempts no prior inpatient psychiatric treatment  Past Medical History: History reviewed. No pertinent past medical history. History reviewed. No pertinent surgical history. Family History: History reviewed. No pertinent family history. Family Psychiatric  History: Positive for just anxiety and possibly substance abuse Social History:  History  Alcohol Use  . Yes     History  Drug Use    Comment: oxycodone abuse    Social History   Social History  . Marital status: Single    Spouse name: N/A  . Number of children: N/A  . Years of education: N/A   Social History Main Topics  . Smoking status: Current Every Day Smoker    Types: Cigarettes  . Smokeless tobacco: Never Used  . Alcohol use Yes  . Drug use: Yes     Comment: oxycodone abuse  . Sexual activity: Not Asked   Other Topics Concern  . None   Social History Narrative  . None    Hospital Course:  Patient  was admitted here to the hospital for further stabilization. He had already been in a hospital environment for a couple of days prior to transfer. Patient was engaged in group therapy and individually assessed. He has attended groups appropriately. In individual evaluation he has shown good insight into his substance abuse problems. Patient has consistently denied any suicidal thought or intent. In interview with me he appears to be sincere. Does not appear to be overly manipulative. His affect is euthymic. He appears to show regret for his behavior and desire to engage in substance abuse treatment. He has been continued on antibiotic for a tooth infection which has shown improved symptoms. When necessary trazodone but otherwise no specific medicine for psychiatric needs and he this point does not appear to need an antidepressant. He has been educated about appropriate outpatient substance abuse treatment. Asian understands the options of inpatient treatment but prefers to go back home. He will be referred for local outpatient substance abuse treatment.  Physical Findings: AIMS:  , ,  ,  ,    CIWA:    COWS:  COWS Total Score: 3  Musculoskeletal: Strength & Muscle Tone: within normal limits Gait & Station: normal Patient leans: N/A  Psychiatric Specialty Exam: Physical Exam  Nursing note and vitals reviewed. Constitutional: He appears well-developed and well-nourished.  HENT:  Head: Normocephalic and atraumatic.  Eyes: Conjunctivae are normal. Pupils are equal, round, and reactive to light.  Neck: Normal range of motion.  Cardiovascular: Regular rhythm and normal heart sounds.   Respiratory: Effort normal. No respiratory distress.  GI: Soft.  Musculoskeletal: Normal range of motion.  Neurological: He is alert.  Skin: Skin is warm and dry.  Psychiatric: He has a normal mood and affect. His behavior is normal. Judgment and thought content normal.    Review of Systems  Constitutional:  Negative.   HENT: Negative.        Toothache. He improved on current antibiotic.  Eyes: Negative.   Respiratory: Negative.   Cardiovascular: Negative.   Gastrointestinal: Negative.   Musculoskeletal: Negative.   Skin: Negative.   Neurological: Negative.   Psychiatric/Behavioral: Negative for depression, hallucinations, memory loss, substance abuse and suicidal ideas. The patient is not nervous/anxious and does not have insomnia.     Blood pressure (!) 147/87, pulse 79, temperature 97.8 F (36.6 C), temperature source Oral, resp. rate 18, height 6' (1.829 m), weight 81.6 kg (180 lb), SpO2 100 %.Body mass index is 24.41 kg/m.  General Appearance: Fairly Groomed  Eye Contact:  Good  Speech:  Clear and Coherent  Volume:  Normal  Mood:  Euthymic  Affect:  Congruent  Thought Process:  Goal Directed  Orientation:  Full (Time, Place, and Person)  Thought Content:  Logical  Suicidal Thoughts:  No  Homicidal Thoughts:  No  Memory:  Immediate;   Good Recent;   Fair Remote;   Fair  Judgement:  Fair  Insight:  Fair  Psychomotor Activity:  Normal  Concentration:  Concentration: Fair  Recall:  [employer] of Knowledge:  Fair  Language:  Fair  Akathisia:  No  Handed:  Right  AIMS (if indicated):     Assets:  Communication Skills Desire for Improvement Housing Physical Health Resilience  ADL's:  Intact  Cognition:  WNL  Sleep:  Number of Hours: 7     Have you used any form of tobacco in the last 30 days? (Cigarettes, Smokeless Tobacco, Cigars, and/or Pipes): Yes  Has this patient used any form of tobacco in the last 30 days? (Cigarettes, Smokeless Tobacco, Cigars, and/or Pipes) Yes, Yes, A prescription for an FDA-approved tobacco cessation medication was offered at discharge and the patient refused  Blood Alcohol level:  Lab Results  Component Value Date   ETH <5 10/04/2016   ETH 219 (H) 01/02/2014    Metabolic Disorder Labs:  No results found for: HGBA1C, MPG No results  found for: PROLACTIN No results found for: CHOL, TRIG, HDL, CHOLHDL, VLDL, LDLCALC  See Psychiatric Specialty Exam and Suicide Risk Assessment completed by Attending Physician prior to discharge.  Discharge destination:  Home  Is patient on multiple antipsychotic therapies at discharge:  No   Has Patient had three or more failed trials of antipsychotic monotherapy by history:  No  Recommended Plan for Multiple Antipsychotic Therapies: NA  Discharge Instructions    Diet - low sodium heart healthy    Complete by:  As directed    Increase activity slowly    Complete by:  As directed      Allergies as of 10/08/2016      Reactions   Penicillins Hives, Swelling      Medication List    STOP taking these medications   clonazePAM 1 MG tablet Commonly known as:  KLONOPIN   gabapentin 300 MG capsule Commonly known as:  NEURONTIN   methocarbamol 750 MG tablet Commonly known as:  ROBAXIN   promethazine 25 MG tablet Commonly known as:  PHENERGAN     TAKE these medications     Indication  clindamycin 300 MG capsule Commonly known  as:  CLEOCIN Take 1 capsule (300 mg total) by mouth every 6 (six) hours. What changed:  when to take this  additional instructions  Indication:  Infection of the Skin and/or Related Soft Tissue   traZODone 100 MG tablet Commonly known as:  DESYREL Take 1 tablet (100 mg total) by mouth at bedtime as needed for sleep.  Indication:  Trouble Sleeping        Follow-up recommendations:  Activity:  Activity as tolerated Diet:  Regular diet Other:  Outpatient substance abuse treatment. Recommend finding dental care as soon as possible  Comments:  Patient will be discharged today to outpatient substance abuse treatment in the community. No longer appears to be at acute risk of dangerousness. Educated about substance abuse treatment. Case reviewed with nursing.  Signed: Mordecai Rasmussen, MD 10/08/2016, 12:48 PM

## 2016-10-08 NOTE — Progress Notes (Signed)
D: Patient is pleasant upon approach.  He refused his oral gel and mouth rinse stating, "It doesn't help me."  Patient rates his pain at 3/4.  His pain is on the left side of his jaw.  He states, "I had a huge abscess."  Patient is currently on an antibiotic for infection.  He was originally a high fall risk due to a fall when he was admitted to [facility].  He is steady and ambulates well at this time, so he was made a low fall risk.  He denies any thoughts of self harm.  He states, "I would like to be discharged today.  I have a lot of people to take amends to."  He talks about his girlfriend often.  He denies any HI and does not appear to be responding to internal stimuli.   A: Continue to monitor medication management and MD orders.  Safety checks continued every 15 minutes per protocol.  Offer support and encouragement as needed. R: Patient is receptive to staff; his behavior is appropriate.

## 2016-10-08 NOTE — Progress Notes (Signed)
  [facility] Discharge [facility] :  Will you be returning to the same living situation after discharge:  Yes,  home with brother At discharge, do you have transportation home?: Yes,  family Do you have the ability to pay for your medications: Yes,  patient has financial assitance from family.   Release of information consent forms completed and in the chart;  Patient's signature needed at discharge.  Patient to Follow up at: Follow-up Information    [facility] [employer] Go to.   Why:  [facility] services including Medication management and outpatient therapy. Walk-in hours for new patients are M-F 8a-4p. Arrive early for prompt service. Bring Photo I.D., current medications, and discharge summary with you.  Contact information: [address] [place] [address] [phone]        [facility] Call on 10/09/2016.   Why:  Follow-up with [facility] for additional resources for outpatient substance abuse counseling and treatment.  Contact information: [facility] [address] [place], [place] [address] P: [phone] F: [phone]          Next level of care provider has access to [facility] Link:no  Safety Planning and Suicide Prevention discussed: Yes,  with patient and his brother.   Have you used any form of tobacco in the last 30 days? (Cigarettes, Smokeless Tobacco, Cigars, and/or Pipes): Yes  Has patient been referred to the Quitline?: Patient refused referral  Patient has been referred for addiction treatment: Yes  Dayona Shaheen G. Garnette CzechSampson MSW, LCSWA 10/08/2016, 2:38 PM

## 2016-10-08 NOTE — [facility] Suicide Risk Assessment (Signed)
BActivity:  Activity as tolerated Diet:  Regular diet  Other:  [facility]   Principal Problem: Depressive disorder Discharge Diagnoses:  Patient Active Problem List   Diagnosis Date Noted  . Opioid use disorder, severe, dependence (HCC) [F11.20] 10/06/2016  . Opiate withdrawal (HCC) [F11.23] 10/06/2016  . Tobacco use disorder [F17.200] 10/06/2016  . Gingivitis [K05.10] 10/06/2016  . Unspecified depressive disorder [F32.9] 10/06/2016    Total Time spent with patient: 45 minutes  Musculoskeletal: Strength & Muscle Tone: within normal limits Gait & Station: normal Patient leans: N/A  Psychiatric Specialty Exam: Review of Systems  Constitutional: Negative.   HENT: Negative.   Eyes: Negative.   Respiratory: Negative.   Cardiovascular: Negative.   Gastrointestinal: Negative.   Musculoskeletal: Negative.   Skin: Negative.   Neurological: Negative.   Psychiatric/Behavioral: Negative for depression, hallucinations, memory loss, substance abuse and suicidal ideas. The patient is not nervous/anxious and does not have insomnia.     Blood pressure (!) 147/87, pulse 79, temperature 97.8 F (36.6 C), temperature source Oral, resp. rate 18, height 6' (1.829 m), weight 81.6 kg (180 lb), SpO2 100 %.Body mass index is 24.41 kg/m.  General Appearance: Fairly Groomed  Patent attorneyye Contact::  Good  Speech:  Clear and Coherent409  Volume:  Normal  Mood:  Euthymic  Affect:  Congruent  Thought Process:  Goal Directed  Orientation:  Full (Time, Place, and Person)  Thought Content:  Logical  Suicidal Thoughts:  No  Homicidal Thoughts:  No  Memory:  Immediate;   Fair Recent;   Fair Remote;   Fair  Judgement:  Fair  Insight:  Fair  Psychomotor Activity:  Decreased  Concentration:  Fair  Recall:  FiservFair  Fund of Knowledge:Fair  Language: Fair  Akathisia:  No  Handed:  Right  AIMS (if indicated):     Assets:  Desire for  Improvement Housing Physical Health Resilience Social Support  Sleep:  Number of Hours: 7  Cognition: WNL  ADL's:  Intact   Mental Status Per Nursing Assessment::   On Admission:     Demographic Factors:  Male, Adolescent or young adult, Caucasian and Unemployed  Loss Factors: Financial problems/change in socioeconomic status  Historical Factors: Impulsivity  Risk Reduction Factors:   Sense of responsibility to family, Religious beliefs about death, Living with another person, especially a relative and Positive social support  Continued Clinical Symptoms:  Alcohol/Substance Abuse/Dependencies  Cognitive Features That Contribute To Risk:  Loss of executive function    Suicide Risk:  Mild:  Suicidal ideation of limited frequency, intensity, duration, and specificity.  There are no identifiable plans, no associated intent, mild dysphoria and related symptoms, good self-control (both objective and subjective assessment), few other risk factors, and identifiable protective factors, including available and accessible social support.    Plan Of CarActivity as tolerated. Appears to be back to usual level of physical health.ollow-up recommendations:  Activity:  Activity as tolerated. Appears to be back to usual level of physical health. Diet:  Regular Other:  Patient will be referred to outpatient follow-up for substance abuse treatment. He will be strongly encouraged to seek dental care as soon as possible.  Mordecai RasmussenJohn Clapacs, MD 10/08/2016, 12:44 PM

## 2016-10-08 NOTE — Progress Notes (Signed)
Discharge note:  Patient discharged home per MD order.  Patient received all personal belongings from unit and locker.  Reviewed AVS/Transition record and patient indicated understanding.  He denies any thoughts of self harm.  Patient received prescriptions and samples of his antibiotics.  He left ambulatory with his father.

## 2016-10-08 NOTE — [facility] Group Notes (Signed)
[facility] Group Notes:  (Nursing/MHT/Case Management/Adjunct)  Date:  10/08/2016  Time:  12:22 AM  Type of Therapy:  Group Therapy  Participation Level:  Active  Participation Quality:  Appropriate  Affect:  Appropriate  Cognitive:  Appropriate  Insight:  Appropriate  Engagement in Group:  Engaged  Modes of Intervention:  n/a  Summary of Progress/Problems:   Veva Holesshley Imani Joni Norrod 10/08/2016, 12:22 AM

## 2016-10-08 NOTE — [facility] Group Notes (Signed)
[facility] LCSW Group Therapy  10/08/2016 2:39 PM  Type of Therapy:  Group Therapy  Participation Level:  Active  Participation Quality:  Appropriate and Sharing  Affect:  Appropriate  Cognitive:  Alert  Insight:  Developing/Improving  Engagement in Therapy:  Engaged  Modes of Intervention:  Activity, Discussion, Education, Problem-solving, Dance movement psychotherapisteality Testing, Socialization and Support  Summary of Progress/Problems: Stress management: Patients defined and discussed the topic of stress and the related symptoms and triggers for stress. Patients identified healthy coping skills they would like to try during hospitalization and after discharge to manage stress in a healthy way. CSW offered insight to varying stress management techniques.   Willie Ruiz MSW, LCSWA 10/08/2016, 2:40 PM

## 2017-03-30 ENCOUNTER — Emergency Department (HOSPITAL_COMMUNITY)
Admission: EM | Admit: 2017-03-30 | Discharge: 2017-03-30 | Disposition: A | Discharge status: Home / Self Care | Payer: Self-pay | Attending: Emergency Medicine | Admitting: Emergency Medicine

## 2017-03-30 ENCOUNTER — Encounter (HOSPITAL_COMMUNITY): Payer: Self-pay | Admitting: Emergency Medicine

## 2017-03-30 ENCOUNTER — Emergency Department (HOSPITAL_COMMUNITY): Payer: Self-pay

## 2017-03-30 DIAGNOSIS — Z5321 Procedure and treatment not carried out due to patient leaving prior to being seen by health care provider: Secondary | ICD-10-CM | POA: Insufficient documentation

## 2017-03-30 DIAGNOSIS — F1110 Opioid abuse, uncomplicated: Secondary | ICD-10-CM | POA: Insufficient documentation

## 2017-03-30 DIAGNOSIS — R0602 Shortness of breath: Secondary | ICD-10-CM | POA: Insufficient documentation

## 2017-03-30 LAB — COMPREHENSIVE METABOLIC PANEL
ALT: 33 U/L (ref 17–63)
ANION GAP: 11 (ref 5–15)
AST: 22 U/L (ref 15–41)
Albumin: 4.4 g/dL (ref 3.5–5.0)
Alkaline Phosphatase: 70 U/L (ref 38–126)
BUN: 10 mg/dL (ref 6–20)
CHLORIDE: 100 mmol/L — AB (ref 101–111)
CO2: 26 mmol/L (ref 22–32)
CREATININE: 0.99 mg/dL (ref 0.61–1.24)
Calcium: 9.1 mg/dL (ref 8.9–10.3)
Glucose, Bld: 89 mg/dL (ref 65–99)
Potassium: 3.4 mmol/L — ABNORMAL LOW (ref 3.5–5.1)
SODIUM: 137 mmol/L (ref 135–145)
Total Bilirubin: 0.9 mg/dL (ref 0.3–1.2)
Total Protein: 7.6 g/dL (ref 6.5–8.1)

## 2017-03-30 LAB — CBC
HEMATOCRIT: 44.9 % (ref 39.0–52.0)
Hemoglobin: 15.4 g/dL (ref 13.0–17.0)
MCH: 30.3 pg (ref 26.0–34.0)
MCHC: 34.3 g/dL (ref 30.0–36.0)
MCV: 88.2 fL (ref 78.0–100.0)
PLATELETS: 257 10*3/uL (ref 150–400)
RBC: 5.09 MIL/uL (ref 4.22–5.81)
RDW: 13.1 % (ref 11.5–15.5)
WBC: 8.3 10*3/uL (ref 4.0–10.5)

## 2017-03-30 LAB — ETHANOL

## 2017-03-30 NOTE — ED Triage Notes (Signed)
Reports wanting help with rehab.  States I shot up heroine today for the first time.  Hx of oxycodone abuse and states I fel sob and "bad" since using today.  Breathing easy and nonlabored in triage.

## 2017-03-30 NOTE — ED Notes (Signed)
Pt walked out.  Visitor came back and states pt is going to a "drug treatment place."

## 2018-01-10 IMAGING — DX DG CHEST 2V
2 series · 2 of 2 positions shown · non-contrast
Comparison: 01/23/2004

CLINICAL DATA: Nausea and shortness of breath

EXAM:
CHEST  2 VIEW

[chest pa]
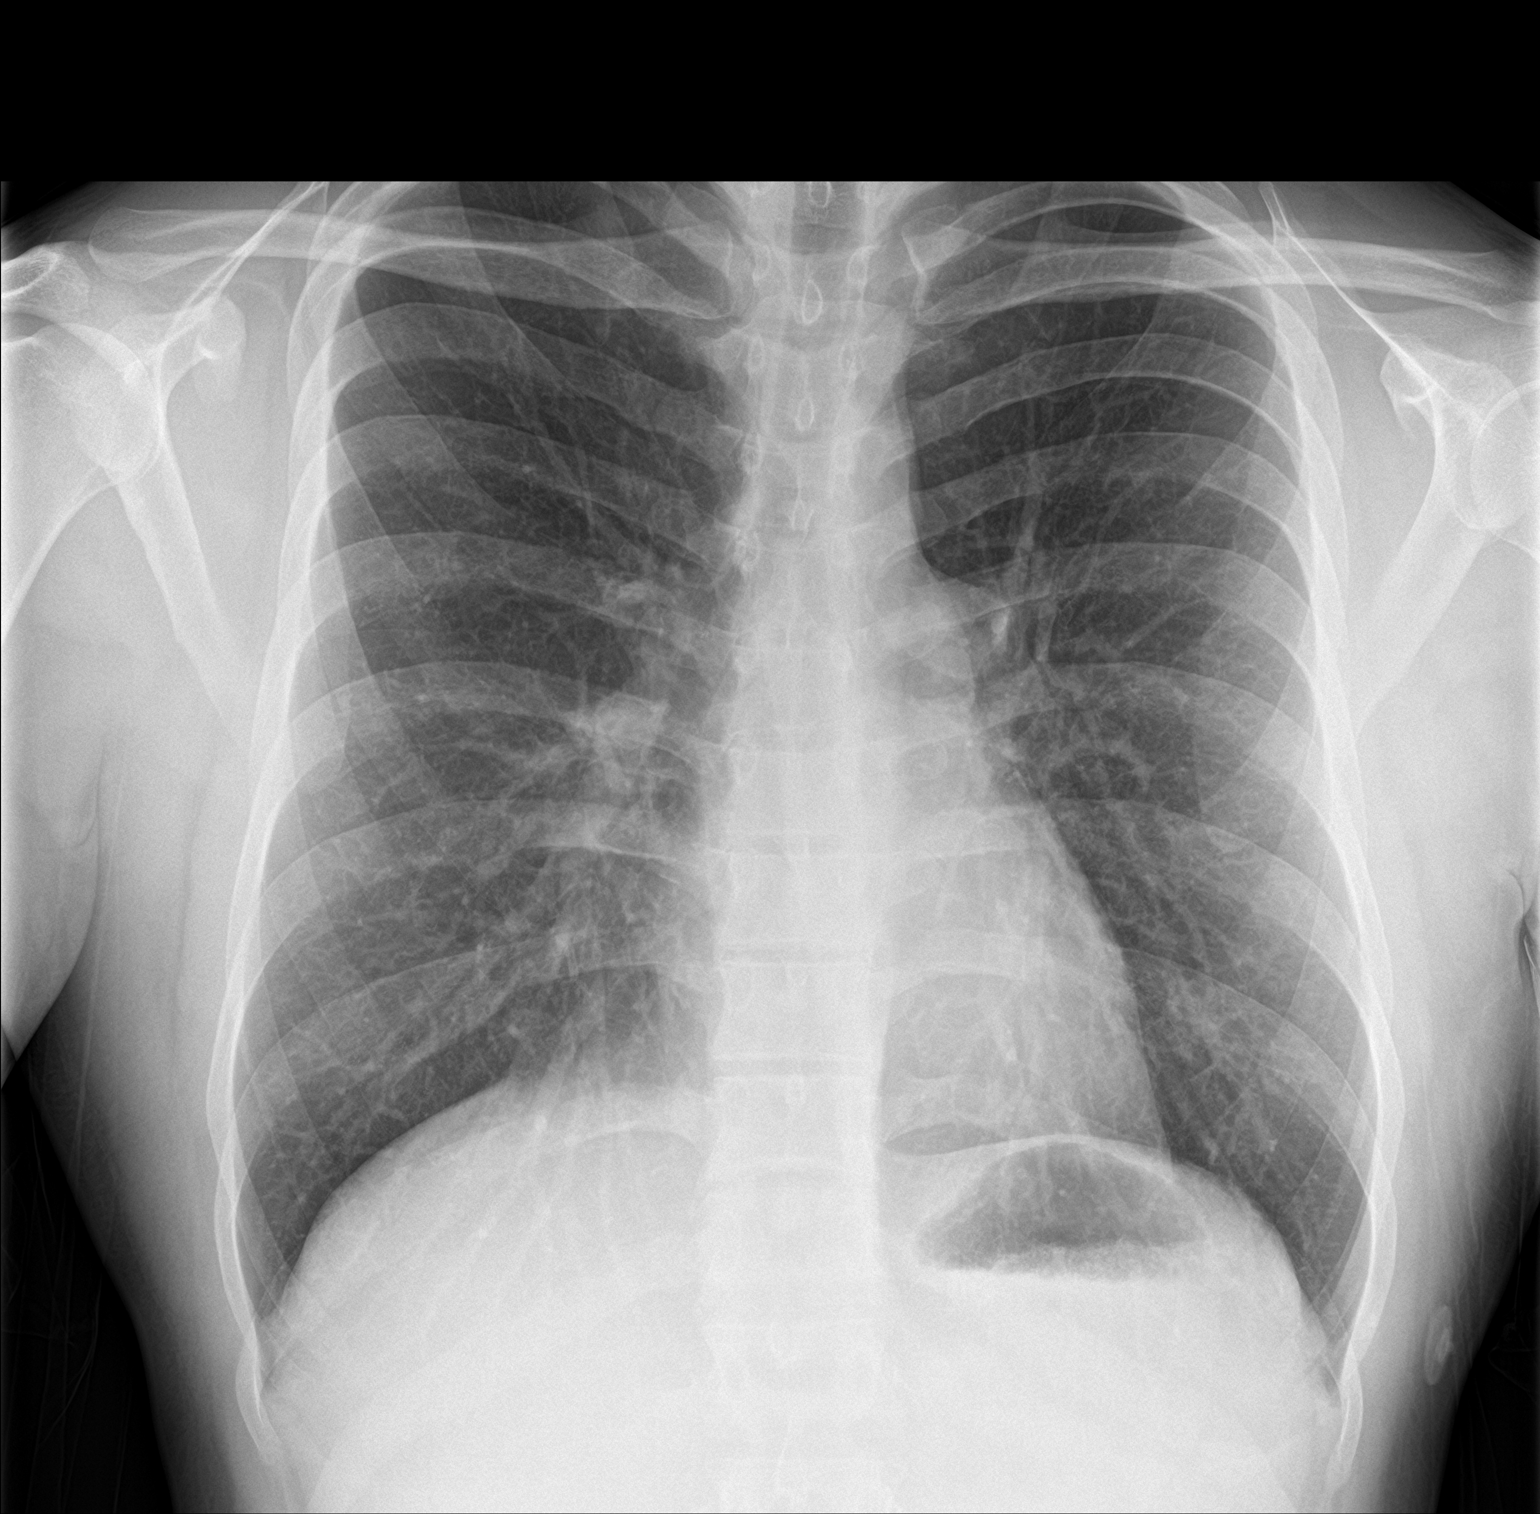

[chest lat]
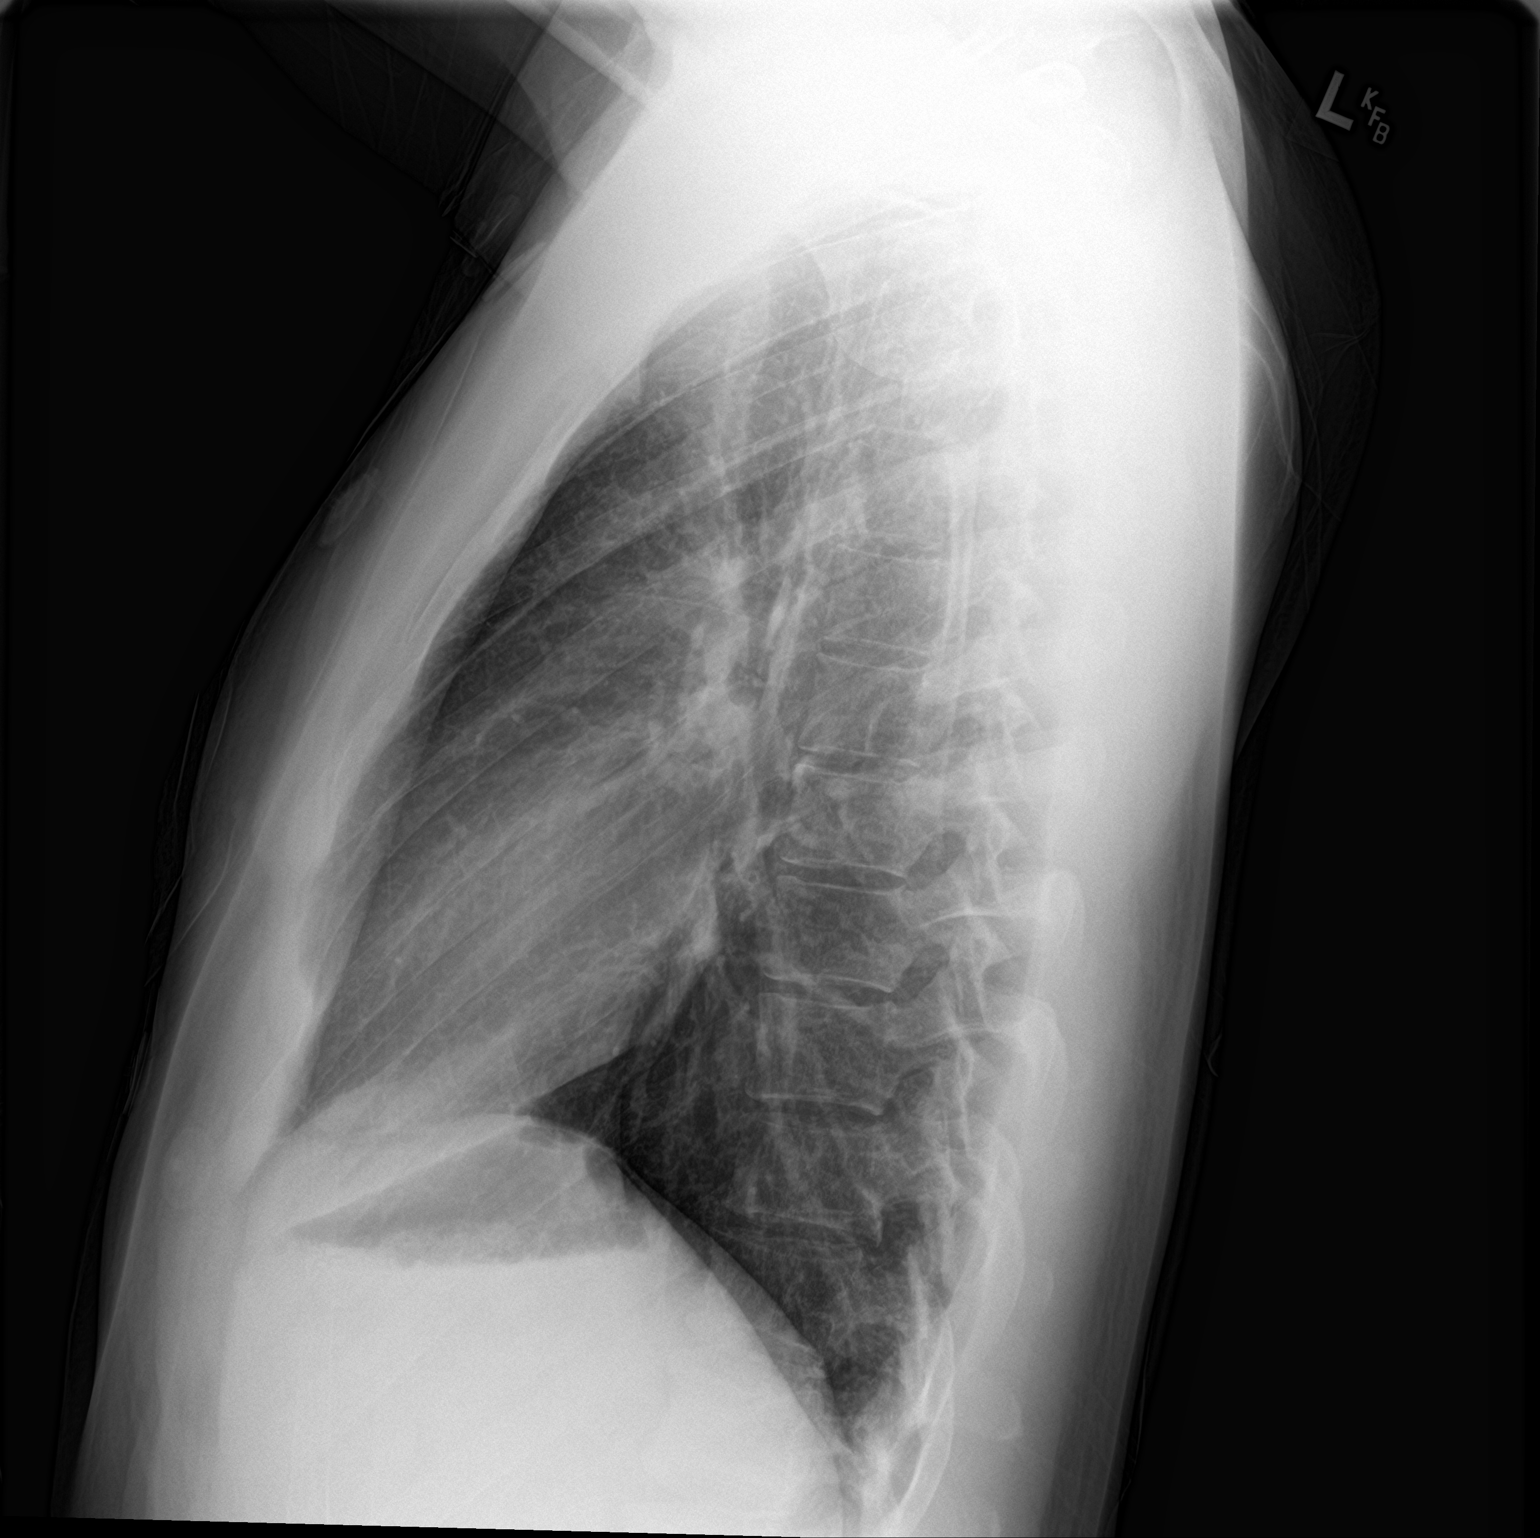

[2 of 2 positions shown; findings below may reference images not displayed]

FINDINGS: The heart size and mediastinal contours are within normal limits.
Both lungs are clear. The visualized skeletal structures are
unremarkable.
IMPRESSION: No active cardiopulmonary disease.

## 2018-08-26 DIAGNOSIS — F1110 Opioid abuse, uncomplicated: Secondary | ICD-10-CM

## 2018-08-26 DIAGNOSIS — M60001 Infective myositis, unspecified left arm: Secondary | ICD-10-CM

## 2018-08-26 DIAGNOSIS — E871 Hypo-osmolality and hyponatremia: Secondary | ICD-10-CM

## 2020-07-24 DEATH — deceased
